# Patient Record
Sex: Male | Born: 1940 | Race: White | Hispanic: No | Marital: Married | Smoking: Former smoker
Health system: Southern US, Community
[De-identification: ages and names within clinical notes are randomized; demographics above are authoritative.]

---

## 2006-07-26 DIAGNOSIS — R609 Edema, unspecified: Secondary | ICD-10-CM | POA: Insufficient documentation

## 2006-07-26 DIAGNOSIS — Z720 Tobacco use: Secondary | ICD-10-CM | POA: Insufficient documentation

## 2006-12-17 DIAGNOSIS — R5381 Other malaise: Secondary | ICD-10-CM | POA: Insufficient documentation

## 2008-08-28 DIAGNOSIS — K4021 Bilateral inguinal hernia, without obstruction or gangrene, recurrent: Secondary | ICD-10-CM | POA: Insufficient documentation

## 2009-10-25 ENCOUNTER — Ambulatory Visit: Payer: Self-pay | Admitting: Family Medicine

## 2010-08-08 ENCOUNTER — Inpatient Hospital Stay: Payer: Self-pay | Admitting: Surgery

## 2010-08-08 HISTORY — PX: HERNIA REPAIR: SHX51

## 2010-10-07 ENCOUNTER — Ambulatory Visit: Payer: Self-pay | Admitting: Surgery

## 2010-11-12 ENCOUNTER — Ambulatory Visit: Payer: Self-pay | Admitting: Gastroenterology

## 2011-01-12 ENCOUNTER — Ambulatory Visit: Payer: Self-pay | Admitting: Surgery

## 2012-04-21 ENCOUNTER — Ambulatory Visit: Payer: Self-pay | Admitting: Family Medicine

## 2012-09-08 LAB — PSA: PSA: 0.5

## 2013-08-03 LAB — BASIC METABOLIC PANEL
BUN: 20 mg/dL (ref 4–21)
CREATININE: 1.4 mg/dL — AB (ref <=1.3)
Glucose: 95 mg/dL
Potassium: 5 mmol/L (ref 3.4–5.3)
SODIUM: 141 mmol/L (ref 137–147)

## 2013-08-03 LAB — HEPATIC FUNCTION PANEL
ALK PHOS: 51 U/L (ref 25–125)
ALT: 9 U/L — AB (ref 10–40)
AST: 16 U/L (ref 14–40)
BILIRUBIN, TOTAL: 0.5 mg/dL

## 2013-11-09 LAB — TSH: TSH: 1.37 u[IU]/mL (ref <=5.90)

## 2013-12-20 ENCOUNTER — Emergency Department: Payer: Self-pay | Admitting: Emergency Medicine

## 2013-12-20 LAB — CBC WITH DIFFERENTIAL/PLATELET
BASOS PCT: 0.6 %
Basophil #: 0 10*3/uL (ref 0.0–0.1)
Eosinophil #: 0.3 10*3/uL (ref 0.0–0.7)
Eosinophil %: 3 %
HCT: 47.8 % (ref 40.0–52.0)
HGB: 15.5 g/dL (ref 13.0–18.0)
LYMPHS ABS: 1.5 10*3/uL (ref 1.0–3.6)
LYMPHS PCT: 17.4 %
MCH: 32.8 pg (ref 26.0–34.0)
MCHC: 32.5 g/dL (ref 32.0–36.0)
MCV: 101 fL — ABNORMAL HIGH (ref 80–100)
MONOS PCT: 7.1 %
Monocyte #: 0.6 x10 3/mm (ref 0.2–1.0)
Neutrophil #: 6.1 10*3/uL (ref 1.4–6.5)
Neutrophil %: 71.9 %
PLATELETS: 339 10*3/uL (ref 150–440)
RBC: 4.73 10*6/uL (ref 4.40–5.90)
RDW: 13.8 % (ref 11.5–14.5)
WBC: 8.5 10*3/uL (ref 3.8–10.6)

## 2013-12-20 LAB — URINALYSIS, COMPLETE
BILIRUBIN, UR: NEGATIVE
Bacteria: NONE SEEN
Blood: NEGATIVE
Glucose,UR: NEGATIVE mg/dL (ref 0–75)
Hyaline Cast: 2
Ketone: NEGATIVE
LEUKOCYTE ESTERASE: NEGATIVE
Nitrite: NEGATIVE
PH: 7 (ref 4.5–8.0)
Protein: NEGATIVE
RBC,UR: 2 /HPF (ref 0–5)
SQUAMOUS EPITHELIAL: NONE SEEN
Specific Gravity: 1.016 (ref 1.003–1.030)

## 2013-12-20 LAB — COMPREHENSIVE METABOLIC PANEL
ALT: 18 U/L
ANION GAP: 0 — AB (ref 7–16)
Albumin: 4.1 g/dL (ref 3.4–5.0)
Alkaline Phosphatase: 48 U/L
BUN: 20 mg/dL — ABNORMAL HIGH (ref 7–18)
Bilirubin,Total: 0.5 mg/dL (ref 0.2–1.0)
CO2: 30 mmol/L (ref 21–32)
Calcium, Total: 8.8 mg/dL (ref 8.5–10.1)
Chloride: 104 mmol/L (ref 98–107)
Creatinine: 1.57 mg/dL — ABNORMAL HIGH (ref 0.60–1.30)
EGFR (African American): 56 — ABNORMAL LOW
EGFR (Non-African Amer.): 46 — ABNORMAL LOW
Glucose: 136 mg/dL — ABNORMAL HIGH (ref 65–99)
POTASSIUM: 4.7 mmol/L (ref 3.5–5.1)
SGOT(AST): 30 U/L (ref 15–37)
Sodium: 134 mmol/L — ABNORMAL LOW (ref 136–145)
Total Protein: 7.6 g/dL (ref 6.4–8.2)

## 2013-12-20 LAB — TROPONIN I: Troponin-I: 0.02 ng/mL

## 2014-05-10 LAB — LIPID PANEL
Cholesterol: 178 mg/dL (ref 0–200)
HDL: 35 mg/dL (ref 35–70)
LDL CALC: 103 mg/dL
Triglycerides: 200 mg/dL — AB (ref 40–160)

## 2014-05-10 LAB — CBC AND DIFFERENTIAL
HEMATOCRIT: 44 % (ref 41–53)
Hemoglobin: 14.7 g/dL (ref 13.5–17.5)
NEUTROS ABS: 51 /uL
Platelets: 318 10*3/uL (ref 150–399)
WBC: 7.5 10*3/mL

## 2014-06-27 DIAGNOSIS — F419 Anxiety disorder, unspecified: Secondary | ICD-10-CM | POA: Insufficient documentation

## 2014-06-27 DIAGNOSIS — H6120 Impacted cerumen, unspecified ear: Secondary | ICD-10-CM | POA: Insufficient documentation

## 2014-06-27 DIAGNOSIS — K30 Functional dyspepsia: Secondary | ICD-10-CM | POA: Insufficient documentation

## 2014-06-27 DIAGNOSIS — IMO0001 Reserved for inherently not codable concepts without codable children: Secondary | ICD-10-CM | POA: Insufficient documentation

## 2014-06-27 DIAGNOSIS — N433 Hydrocele, unspecified: Secondary | ICD-10-CM | POA: Insufficient documentation

## 2014-06-27 DIAGNOSIS — B029 Zoster without complications: Secondary | ICD-10-CM | POA: Insufficient documentation

## 2014-06-27 DIAGNOSIS — L299 Pruritus, unspecified: Secondary | ICD-10-CM | POA: Insufficient documentation

## 2014-06-27 DIAGNOSIS — R634 Abnormal weight loss: Secondary | ICD-10-CM | POA: Insufficient documentation

## 2014-06-27 DIAGNOSIS — R079 Chest pain, unspecified: Secondary | ICD-10-CM | POA: Insufficient documentation

## 2014-06-27 DIAGNOSIS — R799 Abnormal finding of blood chemistry, unspecified: Secondary | ICD-10-CM | POA: Insufficient documentation

## 2014-06-27 DIAGNOSIS — E785 Hyperlipidemia, unspecified: Secondary | ICD-10-CM | POA: Insufficient documentation

## 2014-06-27 DIAGNOSIS — Q394 Esophageal web: Secondary | ICD-10-CM | POA: Insufficient documentation

## 2014-06-27 DIAGNOSIS — I10 Essential (primary) hypertension: Secondary | ICD-10-CM | POA: Insufficient documentation

## 2014-06-27 DIAGNOSIS — G2581 Restless legs syndrome: Secondary | ICD-10-CM | POA: Insufficient documentation

## 2014-06-27 DIAGNOSIS — J449 Chronic obstructive pulmonary disease, unspecified: Secondary | ICD-10-CM | POA: Insufficient documentation

## 2014-06-27 DIAGNOSIS — Z8719 Personal history of other diseases of the digestive system: Secondary | ICD-10-CM | POA: Insufficient documentation

## 2014-06-27 DIAGNOSIS — R0602 Shortness of breath: Secondary | ICD-10-CM | POA: Insufficient documentation

## 2014-06-27 DIAGNOSIS — K589 Irritable bowel syndrome without diarrhea: Secondary | ICD-10-CM | POA: Insufficient documentation

## 2014-06-27 DIAGNOSIS — F4323 Adjustment disorder with mixed anxiety and depressed mood: Secondary | ICD-10-CM | POA: Insufficient documentation

## 2014-08-20 ENCOUNTER — Ambulatory Visit (INDEPENDENT_AMBULATORY_CARE_PROVIDER_SITE_OTHER): Payer: Medicare PPO | Admitting: Family Medicine

## 2014-08-20 ENCOUNTER — Encounter: Payer: Self-pay | Admitting: Family Medicine

## 2014-08-20 VITALS — BP 118/78 | HR 90 | Temp 97.5°F | Resp 16 | Wt 170.4 lb

## 2014-08-20 DIAGNOSIS — K219 Gastro-esophageal reflux disease without esophagitis: Secondary | ICD-10-CM | POA: Diagnosis not present

## 2014-08-20 DIAGNOSIS — K222 Esophageal obstruction: Secondary | ICD-10-CM

## 2014-08-20 DIAGNOSIS — I10 Essential (primary) hypertension: Secondary | ICD-10-CM | POA: Diagnosis not present

## 2014-08-20 DIAGNOSIS — F411 Generalized anxiety disorder: Secondary | ICD-10-CM | POA: Diagnosis not present

## 2014-08-20 DIAGNOSIS — J449 Chronic obstructive pulmonary disease, unspecified: Secondary | ICD-10-CM | POA: Diagnosis not present

## 2014-08-20 DIAGNOSIS — Q394 Esophageal web: Secondary | ICD-10-CM | POA: Diagnosis not present

## 2014-08-20 NOTE — Progress Notes (Signed)
Subjective:    Patient ID: Andrew Hall, male    DOB: May 01, 1940, 74 y.o.   MRN: [id number]  HPI Follow-up - Hypertension stable without chest pains or palpitations. Still taking Lisinopril-HCTZ without muscle cramps of significance.   COPD stable and rarely uses Albuterol. Dyspnea only with climbing stairs. No problems with level ground. Evening and early morning cough with sputum production. No wheeze.  Anxiety well controlled with Zoloft and Clonazepam at bedtime. Also seems to control restless leg issues. No suicidal thoughts or depression. Still very concerned about wife's declining health with dementia and inability to get out of bed or ambulate without help.  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 Niel Hummer. Still having some dysphagia occasionally with large bolus or dry foods. Still feel the Omeprazole is controlling the dyspepsia. No melena or hematemesis.  Patient Active Problem List   Diagnosis Date Noted  . Abnormal blood chemistry 06/27/2014  . Adjustment disorder with mixed anxiety and depressed mood 06/27/2014  . Anxiety disorder 06/27/2014  . Chest pain on exertion 06/27/2014  . COPD, mild 06/27/2014  . Acid indigestion 06/27/2014  . Can't get food down 06/27/2014  . Breath shortness 06/27/2014  . Herpes zona 06/27/2014  . History of digestive disease 06/27/2014  . Hydrocele 06/27/2014  . HLD (hyperlipidemia) 06/27/2014  . Benign hypertension 06/27/2014  . BP (high blood pressure) 06/27/2014  . 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 Cerumen impaction 06/27/2014  . Pruritic dermatitis 06/27/2014  . Restless leg 06/27/2014  . 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 Unexplained weight loss 06/27/2014  . Inguinal hernia recurrent bilateral 08/28/2008  . 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 Malaise and fatigue 12/17/2006  . Accumulation of fluid in tissues 07/26/2006  . Essential (primary) hypertension 07/26/2006   . Tobacco use 07/26/2006   Past Surgical History  Procedure Laterality Date  . Hernia repair Bilateral 08/08/2010   History  Substance Use Topics  . Smoking status: Former Games developer  . Smokeless tobacco: Not on file  . Alcohol Use: No     Comment: stopped in 2012   Family History  Problem Relation Age of Onset  . Heart disease Mother    Current Outpatient Prescriptions on File Prior to Visit  Medication Sig Dispense Refill  . albuterol (PROVENTIL HFA;VENTOLIN HFA) 108 (90 BASE) MCG/ACT inhaler Inhale 2 puffs into the lungs 4 (four) times daily as needed.    . B Complex Vitamins (VITAMIN B COMPLEX PO) Take by mouth daily.    . clonazePAM (KLONOPIN) 0.5 MG tablet Take 1 tablet by mouth at bedtime.    . fenofibrate 160 MG tablet Take 1 tablet by mouth daily.    Marland Kitchen FLUTICASONE-SALMETEROL 1 Inhaler 2 (two) times daily.    Marland Kitchen lisinopril-hydrochlorothiazide (PRINZIDE,ZESTORETIC) 10-12.5 MG per tablet Take 1 tablet by mouth daily.    . Omega 3 1000 MG CAPS Take 1 capsule by mouth daily.    Marland Kitchen omeprazole (PRILOSEC) 40 MG capsule Take 1 capsule by mouth daily.    . sertraline (ZOLOFT) 50 MG tablet Take 1 tablet by mouth at bedtime.     No current facility-administered medications on file prior to visit.   No Known Allergies  Review of Systems  Constitutional: Negative.   HENT: Negative.   Eyes: Negative.   Respiratory: Positive for cough and wheezing.        COPD causing some morning cough with some dyspnea with climbing stairs.  Cardiovascular:  Negative.   Gastrointestinal: Negative for constipation and blood in stool.       Dysphagia and dyspepsia occasionally  Genitourinary: Negative.   Musculoskeletal: Positive for arthralgias.       Some stiffness and aching in hands occasionally (R>L)  Neurological: Positive for tremors. Negative for dizziness, facial asymmetry and light-headedness.       Mild fine tremor of hands  Psychiatric/Behavioral: The patient is nervous/anxious.    BP  Readings from Last 3 Encounters:  08/20/14 118/78  05/19/14 132/74       BP 118/78 mmHg  Pulse 90  Temp(Src) 97.5 F (36.4 C) (Oral)  Resp 16  Wt 170 lb 6.4 oz (77.293 kg)  SpO2 95%  Objective:   Physical Exam  Constitutional: He is oriented to person, place, and time. He appears well-developed and well-nourished. No distress.  HENT:  Head: Normocephalic and atraumatic.  Right Ear: Hearing and external ear normal.  Left Ear: Hearing and external ear normal.  Nose: Nose normal.  Hearing occasionally more difficult listening to human voices (mid range)  Eyes: Conjunctivae, EOM and lids are normal. Right eye exhibits no discharge. Left eye exhibits no discharge. No scleral icterus.  Neck: Neck supple.  Cardiovascular: Normal rate, regular rhythm and normal heart sounds.   Pulmonary/Chest: Effort normal. No respiratory distress.  Abdominal: Soft. Bowel sounds are normal.  Musculoskeletal: Normal range of motion.  Neurological: He is alert and oriented to person, place, and time.  Skin: Skin is intact. No lesion and no rash noted.  Psychiatric: He has a normal mood and affect. His speech is normal and behavior is normal. Thought content normal.      Assessment & Plan:  1. Essential (primary) hypertension Stable and well controlled. Tolerating Lisinopril/HCTZ without side effects. Good compliance. Will get routine follow up labs and recheck pending reports. - CBC with Differential/Platelet - Comprehensive metabolic panel  2. COPD, mild Some early morning and evening cough. Some slight dyspnea with climbing stairs. No chest pain, palpitations or wheeze. May use Albuterol prn.  3. Generalized anxiety disorder Well controlled with daily Clonazepam and Sertraline at bedtime. Also controls nocturnal restless leg syndrome. Recheck pending lab reports. Continue present regimen. Recommend he schedule annual wellness exam in October 2016.   4. Schatzki's ring Documented by Dr. Niel HummerIftikhar  in 2014 by upper endoscopy. Refuses referral for follow up endoscopy due to wife's very poor health with dementia. He is the main caregiver.  5. GERD with stricture Stable and well controlled with prn use of Omeprazole 40 mg qd. No hematemesis or melena. Limit dry foods, large bolus meats and recheck if worsening. May need referral back to GI. - CBC with Differential/Platelet

## 2014-08-23 DIAGNOSIS — K222 Esophageal obstruction: Secondary | ICD-10-CM | POA: Diagnosis not present

## 2014-08-23 DIAGNOSIS — I10 Essential (primary) hypertension: Secondary | ICD-10-CM | POA: Diagnosis not present

## 2014-08-23 DIAGNOSIS — K219 Gastro-esophageal reflux disease without esophagitis: Secondary | ICD-10-CM | POA: Diagnosis not present

## 2014-08-24 LAB — CBC WITH DIFFERENTIAL/PLATELET
BASOS: 1 %
Basophils Absolute: 0.1 10*3/uL (ref 0.0–0.2)
EOS (ABSOLUTE): 0.5 10*3/uL — ABNORMAL HIGH (ref 0.0–0.4)
Eos: 4 %
Hematocrit: 43 % (ref 37.5–51.0)
Hemoglobin: 14.5 g/dL (ref 12.6–17.7)
IMMATURE GRANULOCYTES: 0 %
Immature Grans (Abs): 0 10*3/uL (ref 0.0–0.1)
Lymphocytes Absolute: 2.7 10*3/uL (ref 0.7–3.1)
Lymphs: 26 %
MCH: 33 pg (ref 26.6–33.0)
MCHC: 33.7 g/dL (ref 31.5–35.7)
MCV: 98 fL — AB (ref 79–97)
MONOS ABS: 0.8 10*3/uL (ref 0.1–0.9)
Monocytes: 8 %
NEUTROS PCT: 61 %
Neutrophils Absolute: 6.4 10*3/uL (ref 1.4–7.0)
Platelets: 372 10*3/uL (ref 150–379)
RBC: 4.4 x10E6/uL (ref 4.14–5.80)
RDW: 13.8 % (ref 12.3–15.4)
WBC: 10.5 10*3/uL (ref 3.4–10.8)

## 2014-08-24 LAB — COMPREHENSIVE METABOLIC PANEL
A/G RATIO: 2 (ref 1.1–2.5)
ALK PHOS: 45 IU/L (ref 39–117)
ALT: 13 IU/L (ref 0–44)
AST: 19 IU/L (ref 0–40)
Albumin: 4.4 g/dL (ref 3.5–4.8)
BUN/Creatinine Ratio: 13 (ref 10–22)
BUN: 18 mg/dL (ref 8–27)
Bilirubin Total: 0.6 mg/dL (ref 0.0–1.2)
CALCIUM: 9.5 mg/dL (ref 8.6–10.2)
CO2: 27 mmol/L (ref 18–29)
Chloride: 98 mmol/L (ref 97–108)
Creatinine, Ser: 1.37 mg/dL — ABNORMAL HIGH (ref 0.76–1.27)
GFR calc Af Amer: 58 mL/min/{1.73_m2} — ABNORMAL LOW (ref >59)
GFR calc non Af Amer: 50 mL/min/{1.73_m2} — ABNORMAL LOW (ref >59)
Globulin, Total: 2.2 g/dL (ref 1.5–4.5)
Glucose: 97 mg/dL (ref 65–99)
POTASSIUM: 5.3 mmol/L — AB (ref 3.5–5.2)
Sodium: 139 mmol/L (ref 134–144)
Total Protein: 6.6 g/dL (ref 6.0–8.5)

## 2014-08-28 ENCOUNTER — Telehealth: Payer: Self-pay

## 2014-08-28 NOTE — Telephone Encounter (Signed)
-----   Message from Tamsen Roersennis E Chrismon, [place] sent at 08/24/2014  6:45 PM EDT ----- Normal blood counts. Kidney function test improving. Recommend he drink plenty of water and recheck levels in 3 months.

## 2014-08-28 NOTE — Telephone Encounter (Signed)
Left patient a detailed message on home voicemail (consent in chart) advising him that labs were normal, drink plenty of water, and call back to schedule follow up appointment.

## 2014-09-07 ENCOUNTER — Telehealth: Payer: Self-pay | Admitting: Family Medicine

## 2014-09-07 DIAGNOSIS — F419 Anxiety disorder, unspecified: Secondary | ICD-10-CM

## 2014-09-07 MED ORDER — CLONAZEPAM 0.5 MG PO TABS: 0.5000 mg | ORAL_TABLET | Freq: Every day | ORAL | Status: DC

## 2014-09-07 NOTE — Telephone Encounter (Signed)
Patient needs a refill on clonazePAM (KLONOPIN) 0.5 MG tablet.  Pt uses [facility] [address].  He has four pills left.  CB# [phone]

## 2014-09-07 NOTE — Telephone Encounter (Signed)
Advise patient the refill has been printed out because it can't be sent electronically. Ready for pick up at the front desk.

## 2014-09-10 ENCOUNTER — Other Ambulatory Visit: Payer: Self-pay | Admitting: Family Medicine

## 2014-09-10 NOTE — Telephone Encounter (Signed)
Advise patient prescription printed on 09-07-14 and ready for pick up at the front desk.

## 2014-09-12 NOTE — Telephone Encounter (Signed)
Documentation shows that pt picked up the prescription on July18th.

## 2014-10-01 ENCOUNTER — Other Ambulatory Visit: Payer: Self-pay | Admitting: Family Medicine

## 2014-10-30 ENCOUNTER — Telehealth: Payer: Self-pay | Admitting: Family Medicine

## 2014-10-30 MED ORDER — FENOFIBRATE 160 MG PO TABS: 160.0000 mg | ORAL_TABLET | Freq: Every day | ORAL | Status: DC

## 2014-10-30 NOTE — Telephone Encounter (Signed)
Needs refill on Fenofibrate 160 mg. To [facility] [address]

## 2014-12-10 ENCOUNTER — Ambulatory Visit (INDEPENDENT_AMBULATORY_CARE_PROVIDER_SITE_OTHER): Payer: Medicare PPO | Admitting: Family Medicine

## 2014-12-10 ENCOUNTER — Encounter: Payer: Self-pay | Admitting: Family Medicine

## 2014-12-10 VITALS — BP 132/60 | HR 51 | Temp 98.1°F | Resp 16 | Ht 67.75 in | Wt 170.6 lb

## 2014-12-10 DIAGNOSIS — Q394 Esophageal web: Secondary | ICD-10-CM | POA: Diagnosis not present

## 2014-12-10 DIAGNOSIS — I10 Essential (primary) hypertension: Secondary | ICD-10-CM

## 2014-12-10 DIAGNOSIS — E785 Hyperlipidemia, unspecified: Secondary | ICD-10-CM

## 2014-12-10 DIAGNOSIS — Z23 Encounter for immunization: Secondary | ICD-10-CM | POA: Diagnosis not present

## 2014-12-10 DIAGNOSIS — Z0000 Encounter for general adult medical examination without abnormal findings: Secondary | ICD-10-CM | POA: Diagnosis not present

## 2014-12-10 DIAGNOSIS — F419 Anxiety disorder, unspecified: Secondary | ICD-10-CM | POA: Diagnosis not present

## 2014-12-10 DIAGNOSIS — J449 Chronic obstructive pulmonary disease, unspecified: Secondary | ICD-10-CM

## 2014-12-10 DIAGNOSIS — H6123 Impacted cerumen, bilateral: Secondary | ICD-10-CM

## 2014-12-10 DIAGNOSIS — G2581 Restless legs syndrome: Secondary | ICD-10-CM

## 2014-12-10 MED ORDER — CLONAZEPAM 0.5 MG PO TABS: 0.5000 mg | ORAL_TABLET | Freq: Every day | ORAL | Status: DC

## 2014-12-10 NOTE — Progress Notes (Signed)
Patient ID: Andrew LinCurtis W Hemmelgarn, male   DOB: 12/03/1940, 74 y.o.   MRN: [id number] Patient: Andrew Hall, Male    DOB: 12/03/1940, 74 y.o.   MRN: [id number] Visit Date: 12/10/2014  Today's Provider: Dortha Kernennis Atara [place], PA   Chief Complaint  Patient presents with  . Annual Exam   Subjective:  Andrew Hall is a 74 y.o. male who presents today for health maintenance and complete physical. He feels fairly well. He reports exercising none (limited due to COPD and wife's poor heath - he is the primary caregiver). He reports he is sleeping well (8-10 hours a night with urinating 1-2 times a night). Feels COPD still causing white sputum in the evening. Not using the Advair but continues Mucinex with a little relief. No fever. Having some nasal congestion and post nasal drip in the evenings. Clonazepam helps him sleep well, controls anxiety and restless leg syndrome. No depressive symptoms. Stressed about wife's poor health and he has to be the caregiver.   Review of Systems  Constitutional: Negative.   HENT: Positive for congestion.   Eyes: Negative.   Respiratory: Negative.   Cardiovascular: Negative.   Gastrointestinal: Negative.   Endocrine: Negative.   Genitourinary: Negative.   Musculoskeletal: Negative.   Skin: Negative.   Allergic/Immunologic: Negative.   Neurological: Negative.   Hematological: Negative.   Psychiatric/Behavioral: Negative.     Social History   Social History  . Marital Status: Married    Spouse Name: N/A  . Number of Children: N/A  . Years of Education: N/A   Occupational History  . Not on file.   Social History Main Topics  . Smoking status: Former Games developermoker  . Smokeless tobacco: Not on file  . Alcohol Use: No     Comment: stopped in 2012  . Drug Use: No  . Sexual Activity: Not on file   Other Topics Concern  . Not on file   Social History Narrative    Patient Active Problem List   Diagnosis Date Noted  . Abnormal blood chemistry 06/27/2014  .  Adjustment disorder with mixed anxiety and depressed mood 06/27/2014  . Anxiety disorder 06/27/2014  . Chest pain on exertion 06/27/2014  . COPD, mild (HCC) 06/27/2014  . Acid indigestion 06/27/2014  . Can't get food down 06/27/2014  . Breath shortness 06/27/2014  . Herpes zona 06/27/2014  . History of digestive disease 06/27/2014  . Hydrocele 06/27/2014  . HLD (hyperlipidemia) 06/27/2014  . Benign hypertension 06/27/2014  . BP (high blood pressure) 06/27/2014  . 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 Cerumen impaction 06/27/2014  . Pruritic dermatitis 06/27/2014  . Restless leg 06/27/2014  . 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 Unexplained weight loss 06/27/2014  . Inguinal hernia recurrent bilateral 08/28/2008  . 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 Malaise and fatigue 12/17/2006  . Accumulation of fluid in tissues 07/26/2006  . Essential (primary) hypertension 07/26/2006  . Tobacco use 07/26/2006    Past Surgical History  Procedure Laterality Date  . Hernia repair Bilateral 08/08/2010    His family history includes Heart disease in his mother.    No Known Allergies   Outpatient Prescriptions Prior to Visit  Medication Sig Dispense Refill  . albuterol (PROVENTIL HFA;VENTOLIN HFA) 108 (90 BASE) MCG/ACT inhaler Inhale 2 puffs into the lungs 4 (four) times daily as needed.    . B Complex Vitamins (VITAMIN B COMPLEX PO) Take by mouth daily.    . clonazePAM (KLONOPIN) 0.5 MG tablet TAKE ONE TABLET BY MOUTH EVERY  DAY AT BEDTIME 30 tablet 0  . fenofibrate 160 MG tablet Take 1 tablet (160 mg total) by mouth daily. 30 tablet 6  . FLUTICASONE-SALMETEROL 1 Inhaler 2 (two) times daily.    Marland Kitchen lisinopril-hydrochlorothiazide (PRINZIDE,ZESTORETIC) 10-12.5 MG per tablet TAKE ONE TABLET BY MOUTH ONCE DAILY 30 tablet 6  . Omega 3 1000 MG CAPS Take 1 capsule by mouth daily.    Marland Kitchen omeprazole (PRILOSEC) 40 MG capsule Take 1 capsule by mouth daily.    . sertraline (ZOLOFT) 50 MG tablet Take 1 tablet by  mouth at bedtime.     No facility-administered medications prior to visit.    Patient Care Team: Tamsen Roers, PA as PCP - General (Physician Assistant)     Objective:   Vitals:  Filed Vitals:   12/10/14 1421  BP: 132/60  Pulse: 51  Temp: 98.1 F (36.7 C)  TempSrc: Oral  Resp: 16  Height: 5' 7.75" (1.721 m)  Weight: 170 lb 9.6 oz (77.384 kg)  SpO2: 95%    Physical Exam  Constitutional: He is oriented to person, place, and time. He appears well-developed and well-nourished.  HENT:  Head: Normocephalic and atraumatic.  Nose: Nose normal.  Mouth/Throat: Oropharynx is clear and moist.  Large amount of cerumen both ear canals.  Eyes: Conjunctivae and EOM are normal. Pupils are equal, round, and reactive to light.  Neck: Normal range of motion. Neck supple.  Cardiovascular: Normal rate, regular rhythm and normal heart sounds.   Pulmonary/Chest: Effort normal.  Distant breath sounds. Slight rhonchi that clears with a cough.   Abdominal: Soft. Bowel sounds are normal.  Musculoskeletal: He exhibits no edema or tenderness.  Back stiffness limiting full flexion.  Neurological: He is alert and oriented to person, place, and time. He has normal reflexes.  Hearing deficit worse with cerumen impaction.  Skin: No rash noted.  Psychiatric: His behavior is normal. Thought content normal.  Anxious and slight "jitters". Worried about caring for wife as she can easily fall.   Depression Screen PHQ 2/9 Scores 12/10/2014  PHQ - 2 Score 1  Anxious and has restless leg syndrome.   Assessment & Plan:     Routine Health Maintenance and Physical Exam  Exercise Activities and Dietary recommendations Goals    Need to lower fats in diet and walk when possible for exercise.      Immunization History  Administered Date(s) Administered  . Pneumococcal Conjugate-13 07/26/2014  . Tdap 09/05/2012  . Zoster 07/26/2014    Health Maintenance  Topic Date Due  . INFLUENZA VACCINE   09/24/2014  . PNA vac Low Risk Adult (2 of 2 - PPSV23) 07/26/2015  . TETANUS/TDAP  09/06/2022  . ZOSTAVAX  Completed      Discussed health benefits of physical activity, and encouraged him to engage in regular exercise appropriate for his age and condition.    ------------------------------------------------------------------------------------------------------------  1. Medicare annual wellness visit, subsequent Good general health. Last colonoscopy 11-12-10 only showing sigmoid diverticulosis. Will up date immunizations with flu shot. Fall screening negative and no alcohol use.   2. Benign hypertension Good control with Lisinopril-HCTZ without side effects. Good compliance. Will get routine labs and recheck prn. - CBC with Differential/Platelet  3. COPD, mild (HCC) History of heavy smoking in the past. Spirometry on 09-05-12 showed moderately severe obstructive disease with low VC. Continues to keep Ventolin-HFA available for prn use. Stopped smoking in 2012.  4. 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 No swallowing difficulty, dyspepsia or melena with routine use of Omeprazole 40 mg qd. Follow up with gastroenterologist as planned previously.  5. Cerumen impaction, bilateral Irrigated cerumen from both ears. Hearing not improved. 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 Declines hearing aid referral at the present.  6. Anxiety disorder, unspecified anxiety disorder type Still quite concerned about wife's poor health and being her primary caregiver. Clonazepam controlling anxiety and helps with sleep without making him too drowsy to respond to wife's needs. Will refill medication and recheck prn. - clonazePAM (KLONOPIN) 0.5 MG tablet; Take 1 tablet (0.5 mg total) by mouth at bedtime.  Dispense: 30 tablet; Refill: 1  7. HLD (hyperlipidemia) Continue Fenofibrate and Omega-3 Fish Oil without side effects. Will  recheck labs and follow up pending reports. - COMPLETE METABOLIC PANEL WITH GFR - Lipid panel - TSH  8. Restless leg Sleeping better with use of Clonazepam without daytime drowsiness. No history of falls or balance issues. Will refill medication and follow up pending lab reports - clonazePAM (KLONOPIN) 0.5 MG tablet; Take 1 tablet (0.5 mg total) by mouth at bedtime.  Dispense: 30 tablet; Refill: 1  9. Need for influenza vaccination - Flu vaccine HIGH DOSE PF

## 2014-12-13 DIAGNOSIS — E785 Hyperlipidemia, unspecified: Secondary | ICD-10-CM | POA: Diagnosis not present

## 2014-12-13 DIAGNOSIS — I10 Essential (primary) hypertension: Secondary | ICD-10-CM | POA: Diagnosis not present

## 2014-12-14 LAB — CBC WITH DIFFERENTIAL/PLATELET
Basophils Absolute: 0.1 10*3/uL (ref 0.0–0.2)
Basos: 1 %
EOS (ABSOLUTE): 0.5 10*3/uL — ABNORMAL HIGH (ref 0.0–0.4)
EOS: 6 %
HEMOGLOBIN: 14.3 g/dL (ref 12.6–17.7)
Hematocrit: 42.9 % (ref 37.5–51.0)
Immature Grans (Abs): 0 10*3/uL (ref 0.0–0.1)
Immature Granulocytes: 0 %
LYMPHS ABS: 2.3 10*3/uL (ref 0.7–3.1)
Lymphs: 30 %
MCH: 32.9 pg (ref 26.6–33.0)
MCHC: 33.3 g/dL (ref 31.5–35.7)
MCV: 99 fL — ABNORMAL HIGH (ref 79–97)
MONOCYTES: 7 %
Monocytes Absolute: 0.5 10*3/uL (ref 0.1–0.9)
NEUTROS ABS: 4.2 10*3/uL (ref 1.4–7.0)
Neutrophils: 56 %
Platelets: 337 10*3/uL (ref 150–379)
RBC: 4.35 x10E6/uL (ref 4.14–5.80)
RDW: 13.4 % (ref 12.3–15.4)
WBC: 7.6 10*3/uL (ref 3.4–10.8)

## 2014-12-14 LAB — TSH: TSH: 1.76 u[IU]/mL (ref 0.450–4.500)

## 2014-12-14 LAB — LIPID PANEL
Chol/HDL Ratio: 4.6 ratio units (ref 0.0–5.0)
Cholesterol, Total: 170 mg/dL (ref 100–199)
HDL: 37 mg/dL — ABNORMAL LOW (ref >39)
LDL CALC: 102 mg/dL — AB (ref 0–99)
Triglycerides: 156 mg/dL — ABNORMAL HIGH (ref 0–149)
VLDL CHOLESTEROL CAL: 31 mg/dL (ref 5–40)

## 2014-12-17 ENCOUNTER — Telehealth: Payer: Self-pay

## 2014-12-17 NOTE — Telephone Encounter (Signed)
Left message to call back.  Thanks, 

## 2014-12-17 NOTE — Telephone Encounter (Signed)
-----   Message from Jodell Ciproennis E [place], [place] sent at 12/14/2014  8:00 PM EDT ----- Thyroid and blood cell counts normal. Triglycerides and cholesterol better. Continue present medications and recheck levels in 6 months.

## 2014-12-17 NOTE — Telephone Encounter (Signed)
Patient and daughter Andrew Charsadvised-aa

## 2014-12-18 ENCOUNTER — Telehealth: Payer: Self-pay

## 2014-12-18 NOTE — Telephone Encounter (Signed)
-----   Message from Dennis E Chrismon, PA sent at 12/14/2014  8:00 PM EDT ----- Thyroid and blood cell counts normal. Triglycerides and cholesterol better. Continue present medications and recheck levels in 6 months. 

## 2014-12-18 NOTE — Telephone Encounter (Signed)
LMTCB

## 2014-12-18 NOTE — Telephone Encounter (Signed)
Advised pt as directed. Pt verbalized fully understanding.  Thanks,   

## 2015-01-07 ENCOUNTER — Other Ambulatory Visit: Payer: Self-pay | Admitting: Family Medicine

## 2015-01-07 MED ORDER — SERTRALINE HCL 50 MG PO TABS: 50.0000 mg | ORAL_TABLET | Freq: Every day | ORAL | Status: DC

## 2015-01-07 NOTE — Telephone Encounter (Signed)
Attempted to contact patient to advise him that RX has been sent to pharmacy. No answer nor voicemail.

## 2015-01-07 NOTE — Telephone Encounter (Signed)
Patient requesting refills on Sertraline.  Call to [facility] [address]

## 2015-02-04 ENCOUNTER — Other Ambulatory Visit: Payer: Self-pay | Admitting: Family Medicine

## 2015-02-04 DIAGNOSIS — G2581 Restless legs syndrome: Secondary | ICD-10-CM

## 2015-02-04 DIAGNOSIS — F419 Anxiety disorder, unspecified: Secondary | ICD-10-CM

## 2015-02-04 MED ORDER — CLONAZEPAM 0.5 MG PO TABS: 0.5000 mg | ORAL_TABLET | Freq: Every day | ORAL | Status: DC

## 2015-02-04 NOTE — Telephone Encounter (Signed)
Advise patient written prescription will be at the front desk for pick up.

## 2015-02-04 NOTE — Telephone Encounter (Signed)
Need refill on Clonazepam please.  Call when ready.  [phone]

## 2015-02-05 NOTE — Telephone Encounter (Signed)
RX can be called in at pharmacy[facility] Left patient a voicemail advising him.

## 2015-03-11 ENCOUNTER — Ambulatory Visit: Payer: Medicare PPO | Admitting: Family Medicine

## 2015-03-18 ENCOUNTER — Ambulatory Visit: Payer: Medicare PPO | Admitting: Family Medicine

## 2015-03-25 ENCOUNTER — Encounter: Payer: Self-pay | Admitting: Family Medicine

## 2015-03-25 ENCOUNTER — Ambulatory Visit (INDEPENDENT_AMBULATORY_CARE_PROVIDER_SITE_OTHER): Payer: Medicare PPO | Admitting: Family Medicine

## 2015-03-25 VITALS — BP 118/78 | HR 76 | Temp 97.6°F | Resp 16 | Wt 172.2 lb

## 2015-03-25 DIAGNOSIS — F4323 Adjustment disorder with mixed anxiety and depressed mood: Secondary | ICD-10-CM

## 2015-03-25 DIAGNOSIS — G2581 Restless legs syndrome: Secondary | ICD-10-CM

## 2015-03-25 DIAGNOSIS — E785 Hyperlipidemia, unspecified: Secondary | ICD-10-CM | POA: Diagnosis not present

## 2015-03-25 DIAGNOSIS — J449 Chronic obstructive pulmonary disease, unspecified: Secondary | ICD-10-CM | POA: Diagnosis not present

## 2015-03-25 DIAGNOSIS — I10 Essential (primary) hypertension: Secondary | ICD-10-CM | POA: Diagnosis not present

## 2015-03-25 NOTE — Progress Notes (Signed)
Patient ID: Andrew Hall, male   DOB: 03-21-1940, 75 y.o.   MRN: [id number]   Patient: Andrew Hall Male    DOB: [birth year]   75 y.o.   MRN: [id number] Visit Date: 03/25/2015  Today's Provider: Dortha Kern, PA   Chief Complaint  Patient presents with  . Hyperlipidemia  . Hypertension  . COPD  . Follow-up   Subjective:    HPI        Hypertension, follow-up:  BP Readings from Last 3 Encounters:  03/25/15 118/78  12/10/14 132/60  08/20/14 118/78    He was last seen for hypertension 3 months ago.  BP at that visit was 132/60. Management since that visit includes none  .He reports excellent compliance with treatment. He is not having side effects.  He is not exercising. He is adherent to low salt diet.   Outside blood pressures are pt is not checking blood pressure outside. He is experiencing none.  Patient denies none.   Cardiovascular risk factors include advanced age (older than 70 for men, 66 for women) and male gender.  Use of agents associated with hypertension: none.   ------------------------------------------------------------------------    Lipid/Cholesterol, Follow-up:   Last seen for this 3 months ago.  Management since that visit includes none.  Last Lipid Panel:    Component Value Date/Time   CHOL 170 12/13/2014 1211   CHOL 178 05/10/2014   TRIG 156* 12/13/2014 1211   HDL 37* 12/13/2014 1211   HDL 35 05/10/2014   CHOLHDL 4.6 12/13/2014 1211   LDLCALC 102* 12/13/2014 1211   LDLCALC 103 05/10/2014    He reports excellent compliance with treatment. He is having side effects.   Wt Readings from Last 3 Encounters:  03/25/15 172 lb 3.2 oz (78.109 kg)  12/10/14 170 lb 9.6 oz (77.384 kg)  08/20/14 170 lb 6.4 oz (77.293 kg)    ------------------------------------------------------------------------ COPD:  follow up. Stable Patient Active Problem List   Diagnosis Date Noted  . Abnormal blood chemistry 06/27/2014  . Adjustment disorder  with mixed anxiety and depressed mood 06/27/2014  . Anxiety disorder 06/27/2014  . Chest pain on exertion 06/27/2014  . COPD, mild (HCC) 06/27/2014  . Acid indigestion 06/27/2014  . Can't get food down 06/27/2014  . Breath shortness 06/27/2014  . Herpes zona 06/27/2014  . History of digestive disease 06/27/2014  . Hydrocele 06/27/2014  . HLD (hyperlipidemia) 06/27/2014  . Benign hypertension 06/27/2014  . BP (high blood pressure) 06/27/2014  . 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 Cerumen impaction 06/27/2014  . Pruritic dermatitis 06/27/2014  . Restless leg 06/27/2014  . 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 Unexplained weight loss 06/27/2014  . Inguinal hernia recurrent bilateral 08/28/2008  . 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 Malaise and fatigue 12/17/2006  . Accumulation of fluid in tissues 07/26/2006  . Essential (primary) hypertension 07/26/2006  . Tobacco use 07/26/2006   Past Surgical History  Procedure Laterality Date  . Hernia repair Bilateral 08/08/2010   Family History  Problem Relation Age of Onset  . Heart disease Mother      Previous Medications   ALBUTEROL (PROVENTIL HFA;VENTOLIN HFA) 108 (90 BASE) MCG/ACT INHALER    Inhale 2 puffs into the lungs 4 (four) times daily as needed.   B COMPLEX VITAMINS (VITAMIN B COMPLEX PO)    Take by mouth daily.   CLONAZEPAM (KLONOPIN) 0.5 MG TABLET    Take 1 tablet (0.5 mg total) by mouth at bedtime.   FENOFIBRATE 160 MG TABLET  Take 1 tablet (160 mg total) by mouth daily.   FLUTICASONE-SALMETEROL    1 Inhaler 2 (two) times daily.   LISINOPRIL-HYDROCHLOROTHIAZIDE (PRINZIDE,ZESTORETIC) 10-12.5 MG PER TABLET    TAKE ONE TABLET BY MOUTH ONCE DAILY   OMEGA 3 1000 MG CAPS    Take 1 capsule by mouth daily.   OMEPRAZOLE (PRILOSEC) 40 MG CAPSULE    Take 1 capsule by mouth daily.   SERTRALINE (ZOLOFT) 50 MG TABLET    Take 1 tablet (50 mg total) by mouth at bedtime.   No Known Allergies  Review of Systems  Constitutional: Negative.     HENT: Negative.   Eyes: Negative.   Respiratory: Negative.   Cardiovascular: Negative.   Gastrointestinal: Negative.   Endocrine: Negative.   Genitourinary: Negative.   Musculoskeletal: Negative.   Skin: Negative.   Allergic/Immunologic: Negative.   Neurological: Negative.   Hematological: Negative.   Psychiatric/Behavioral: Negative.     Social History  Substance Use Topics  . Smoking status: Former Games developer  . Smokeless tobacco: Not on file  . Alcohol Use: No     Comment: stopped in 2012   Objective:   BP 118/78 mmHg  Pulse 76  Temp(Src) 97.6 F (36.4 C) (Oral)  Resp 16  Wt 172 lb 3.2 oz (78.109 kg)  SpO2 95%  Physical Exam  Constitutional: He is oriented to person, place, and time. He appears well-developed and well-nourished. No distress.  HENT:  Head: Normocephalic and atraumatic.  Right Ear: Hearing normal.  Left Ear: Hearing normal.  Nose: Nose normal.  Eyes: Conjunctivae and lids are normal. Right eye exhibits no discharge. Left eye exhibits no discharge. No scleral icterus.  Neck: Neck supple.  Cardiovascular: Normal rate and regular rhythm.   Pulmonary/Chest: Effort normal and breath sounds normal. No respiratory distress.  Abdominal: Soft. Bowel sounds are normal.  Musculoskeletal: Normal range of motion.  Neurological: He is alert and oriented to person, place, and time.  Skin: Skin is intact. No lesion and no rash noted.  Psychiatric: He has a normal mood and affect. His speech is normal and behavior is normal. Thought content normal.      Assessment & Plan:      1. Benign hypertension Well controlled on the Zesteretic without side effects.Limit sodium intake and recheck in 3 months.   2. HLD (hyperlipidemia) Tolerating Fenofibrate 160 mg qd without myalgias.  Lab Results  Component Value Date   CHOL 170 12/13/2014   HDL 37* 12/13/2014   LDLCALC 102* 12/13/2014   TRIG 156* 12/13/2014   CHOLHDL 4.6 12/13/2014    3. COPD, mild (HCC) No  significant dyspnea today. Tolerating Albuterol and Advair with good control of wheeze, cough and congestion  4. Restless leg Sleeping better and less leg movement with use of Clonazepam 0.5 mg hs.  5. Adjustment disorder with mixed anxiety and depressed mood Clonazepam and Sertraline (one each at bedtime) well tolerated without drowsiness. Sleeping well and able to get up easily if his wife needs him at night. Continue present medications and recheck in 3 months for lab recheck.

## 2015-04-08 ENCOUNTER — Other Ambulatory Visit: Payer: Self-pay | Admitting: Family Medicine

## 2015-04-08 DIAGNOSIS — G2581 Restless legs syndrome: Secondary | ICD-10-CM

## 2015-04-08 DIAGNOSIS — F419 Anxiety disorder, unspecified: Secondary | ICD-10-CM

## 2015-04-08 NOTE — Telephone Encounter (Signed)
Please fax in a refill for Clonazepam to [facility] [address].

## 2015-04-10 NOTE — Telephone Encounter (Signed)
Patient called back today wanting to know if his Cl[facility]epam has been sent into [facility].

## 2015-04-11 MED ORDER — CLONAZEPAM 0.5 MG PO TABS: 0.5000 mg | ORAL_TABLET | Freq: Every day | ORAL | Status: DC

## 2015-04-11 NOTE — Telephone Encounter (Signed)
RX called in at [facility] pharmacy. Patient is aware.

## 2015-04-29 ENCOUNTER — Telehealth: Payer: Self-pay | Admitting: Family Medicine

## 2015-04-29 MED ORDER — LISINOPRIL-HYDROCHLOROTHIAZIDE 10-12.5 MG PO TABS: 1.0000 | ORAL_TABLET | Freq: Every day | ORAL | Status: DC

## 2015-04-29 NOTE — Telephone Encounter (Signed)
Needs refill on Lisinopril.

## 2015-05-06 ENCOUNTER — Telehealth: Payer: Self-pay | Admitting: Family Medicine

## 2015-05-06 MED ORDER — SERTRALINE HCL 50 MG PO TABS: 50.0000 mg | ORAL_TABLET | Freq: Every day | ORAL | Status: DC

## 2015-05-06 NOTE — Telephone Encounter (Signed)
Refill sent to chosen pharmacy for Sertraline.

## 2015-05-06 NOTE — Telephone Encounter (Signed)
Patient needs refills called in on Sertraline to [facility] on [address]

## 2015-05-28 ENCOUNTER — Other Ambulatory Visit: Payer: Self-pay | Admitting: Family Medicine

## 2015-05-28 DIAGNOSIS — E785 Hyperlipidemia, unspecified: Secondary | ICD-10-CM

## 2015-05-28 MED ORDER — FENOFIBRATE 160 MG PO TABS: 160.0000 mg | ORAL_TABLET | Freq: Every day | ORAL | Status: DC

## 2015-05-28 NOTE — Telephone Encounter (Signed)
Sent refill of Fenofibrate to the pharmacy. Keep follow up appointment on 06-17-15.

## 2015-05-28 NOTE — Telephone Encounter (Signed)
Pt needs refills on Fenofibrate called to [facility] on [address].

## 2015-05-29 NOTE — Telephone Encounter (Signed)
Left patient a voicemail advising him that RX has been sent to pharmacy and to keep follow up appointment on 06/17/2015

## 2015-06-07 ENCOUNTER — Telehealth: Payer: Self-pay | Admitting: Family Medicine

## 2015-06-07 DIAGNOSIS — G2581 Restless legs syndrome: Secondary | ICD-10-CM

## 2015-06-07 DIAGNOSIS — F419 Anxiety disorder, unspecified: Secondary | ICD-10-CM

## 2015-06-07 MED ORDER — CLONAZEPAM 0.5 MG PO TABS: 0.5000 mg | ORAL_TABLET | Freq: Every day | ORAL | Status: DC

## 2015-06-07 NOTE — Telephone Encounter (Signed)
Phone-in refill of Clonazepam to the [facility] on [address]. Advise patient when done.

## 2015-06-07 NOTE — Telephone Encounter (Signed)
Pt. Needs refill on clonazepam called to [facility] on [address]

## 2015-06-07 NOTE — Telephone Encounter (Signed)
RX called in and pt advised-aa 

## 2015-06-09 ENCOUNTER — Other Ambulatory Visit: Payer: Self-pay | Admitting: Family Medicine

## 2015-06-17 ENCOUNTER — Encounter: Payer: Self-pay | Admitting: Family Medicine

## 2015-06-17 ENCOUNTER — Ambulatory Visit (INDEPENDENT_AMBULATORY_CARE_PROVIDER_SITE_OTHER): Payer: Medicare PPO | Admitting: Family Medicine

## 2015-06-17 VITALS — BP 142/72 | HR 80 | Temp 97.4°F | Resp 16 | Wt 171.4 lb

## 2015-06-17 DIAGNOSIS — F419 Anxiety disorder, unspecified: Secondary | ICD-10-CM

## 2015-06-17 DIAGNOSIS — I10 Essential (primary) hypertension: Secondary | ICD-10-CM | POA: Diagnosis not present

## 2015-06-17 DIAGNOSIS — G2581 Restless legs syndrome: Secondary | ICD-10-CM | POA: Diagnosis not present

## 2015-06-17 DIAGNOSIS — E785 Hyperlipidemia, unspecified: Secondary | ICD-10-CM

## 2015-06-17 NOTE — Progress Notes (Signed)
Patient ID: Andrew Hall, male   DOB: 1940/04/14, 75 y.o.   MRN: [id number]   Patient: Andrew Hall Male    DOB: 06/02/1940   75 y.o.   MRN: [id number] Visit Date: 06/17/2015  Today's Provider: Dortha Kern, PA   Chief Complaint  Patient presents with  . Hyperlipidemia  . Hypertension  . Anxiety  . Follow-up   Subjective:    Anxiety Presents for follow-up visit. Symptoms include nervous/anxious behavior.   Compliance with medications is 76-100%.    Hypertension, follow-up:  BP Readings from Last 3 Encounters:  06/17/15 142/72  03/25/15 118/78  12/10/14 132/60    He was last seen for hypertension 3 months ago.  BP at that visit was 118/78. Management since that visit includes none .He reports good compliance with treatment. He is not having side effects.  He is not exercising. He is adherent to low salt diet.   Outside blood pressures are not being checked. He is experiencing none.  Patient denies none.   Cardiovascular risk factors include advanced age (older than 12 for men, 38 for women), dyslipidemia, hypertension and male gender.  Use of agents associated with hypertension: none.   ------------------------------------------------------------------------    Lipid/Cholesterol, Follow-up:   Last seen for this 3 months ago.  Management since that visit includes none.  Last Lipid Panel:    Component Value Date/Time   CHOL 170 12/13/2014 1211   CHOL 178 05/10/2014   TRIG 156* 12/13/2014 1211   HDL 37* 12/13/2014 1211   HDL 35 05/10/2014   CHOLHDL 4.6 12/13/2014 1211   LDLCALC 102* 12/13/2014 1211   LDLCALC 103 05/10/2014    He reports good compliance with treatment. He is not having side effects.   Wt Readings from Last 3 Encounters:  06/17/15 171 lb 6.4 oz (77.747 kg)  03/25/15 172 lb 3.2 oz (78.109 kg)  12/10/14 170 lb 9.6 oz (77.384 kg)    ------------------------------------------------------------------------   Patient Active Problem  List   Diagnosis Date Noted  . Abnormal blood chemistry 06/27/2014  . Adjustment disorder with mixed anxiety and depressed mood 06/27/2014  . Anxiety disorder 06/27/2014  . Chest pain on exertion 06/27/2014  . COPD, mild (HCC) 06/27/2014  . Acid indigestion 06/27/2014  . Can't get food down 06/27/2014  . Breath shortness 06/27/2014  . Herpes zona 06/27/2014  . History of digestive disease 06/27/2014  . Hydrocele 06/27/2014  . HLD (hyperlipidemia) 06/27/2014  . Benign hypertension 06/27/2014  . BP (high blood pressure) 06/27/2014  . 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 Cerumen impaction 06/27/2014  . Pruritic dermatitis 06/27/2014  . Restless leg 06/27/2014  . 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 Unexplained weight loss 06/27/2014  . Inguinal hernia recurrent bilateral 08/28/2008  . 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 Malaise and fatigue 12/17/2006  . Accumulation of fluid in tissues 07/26/2006  . Essential (primary) hypertension 07/26/2006  . Tobacco use 07/26/2006   Past Surgical History  Procedure Laterality Date  . Hernia repair Bilateral 08/08/2010   Family History  Problem Relation Age of Onset  . Heart disease Mother    Previous Medications   ALBUTEROL (PROVENTIL HFA;VENTOLIN HFA) 108 (90 BASE) MCG/ACT INHALER    Inhale 2 puffs into the lungs 4 (four) times daily as needed.   B COMPLEX VITAMINS (VITAMIN B COMPLEX PO)    Take by mouth daily.   CLONAZEPAM (KLONOPIN) 0.5 MG TABLET    Take 1 tablet (0.5 mg total) by mouth at bedtime.   FENOFIBRATE 160  MG TABLET    Take 1 tablet (160 mg total) by mouth daily.   FLUTICASONE-SALMETEROL    1 Inhaler 2 (two) times daily.   LISINOPRIL-HYDROCHLOROTHIAZIDE (PRINZIDE,ZESTORETIC) 10-12.5 MG TABLET    Take 1 tablet by mouth daily.   OMEGA 3 1000 MG CAPS    Take 1 capsule by mouth daily.   OMEPRAZOLE (PRILOSEC) 40 MG CAPSULE    Take 1 capsule by mouth daily.   SERTRALINE (ZOLOFT) 50 MG TABLET    Take 1 tablet (50 mg total) by mouth at  bedtime.   No Known Allergies  Review of Systems  Constitutional: Negative.   HENT: Negative.   Eyes: Negative.   Respiratory: Negative.        Slight wheezing occasionally but not to the point of needing the Albuterol yet.  Cardiovascular: Negative.   Gastrointestinal: Negative.   Endocrine: Negative.   Genitourinary: Negative.   Musculoskeletal: Negative.   Skin: Negative.   Allergic/Immunologic: Negative.   Neurological: Negative.   Hematological: Negative.   Psychiatric/Behavioral: The patient is nervous/anxious.        Occurs occasionally with stress caring for wife with dementia.    Social History  Substance Use Topics  . Smoking status: Former Games developermoker  . Smokeless tobacco: Not on file  . Alcohol Use: No     Comment: stopped in 2012   Objective:   BP 142/72 mmHg  Pulse 80  Temp(Src) 97.4 F (36.3 C) (Oral)  Resp 16  Wt 171 lb 6.4 oz (77.747 kg) Body mass index is 26.25 kg/(m^2).   Physical Exam  Constitutional: He is oriented to person, place, and time. He appears well-developed and well-nourished.  HENT:  Head: Normocephalic.  Right Ear: External ear normal.  Left Ear: External ear normal.  Nose: Nose normal.  Mouth/Throat: Oropharynx is clear and moist.  Diminished hearing.  Eyes: Conjunctivae and EOM are normal.  Neck: Neck supple. No thyromegaly present.  Cardiovascular: Normal rate, regular rhythm and normal heart sounds.   Pulmonary/Chest: Effort normal. No respiratory distress.  Questionable one rhonchus that clears with a deep breath.  Abdominal: Soft. Bowel sounds are normal.  Lymphadenopathy:    He has no cervical adenopathy.  Neurological: He is alert and oriented to person, place, and time.  Psychiatric: His speech is normal and behavior is normal. Thought content normal. His mood appears anxious.      Assessment & Plan:     1. Anxiety disorder, unspecified anxiety disorder type Stable and well controlled with the Zoloft 50 mg hs and  Clonazepam 0.5 mg hs. No hangover or daytime sleepiness. Continue present dosage and recheck prn. - TSH  2. Restless leg Well controlled with use of Clonazepam at bedtime. No further problems during the day.  3. Essential (primary) hypertension Good control of BP and good tolerance of the Lisinopril/HCTZ 10/12.5 mg qd. Recheck routine labs and follow up for CPE in 4-5 months. - CBC with Differential/Platelet - Comprehensive metabolic panel  4. HLD (hyperlipidemia) Tolerating Fenofibrate 160 mg qd without side effects. Weight stable and good appetite. Will continue low fat diet and recheck lipids. - Comprehensive metabolic panel - Lipid panel - TSH

## 2015-06-20 DIAGNOSIS — E785 Hyperlipidemia, unspecified: Secondary | ICD-10-CM | POA: Diagnosis not present

## 2015-06-20 DIAGNOSIS — F419 Anxiety disorder, unspecified: Secondary | ICD-10-CM | POA: Diagnosis not present

## 2015-06-20 DIAGNOSIS — I10 Essential (primary) hypertension: Secondary | ICD-10-CM | POA: Diagnosis not present

## 2015-06-21 ENCOUNTER — Telehealth: Payer: Self-pay

## 2015-06-21 LAB — CBC WITH DIFFERENTIAL/PLATELET
BASOS ABS: 0.1 10*3/uL (ref 0.0–0.2)
Basos: 1 %
EOS (ABSOLUTE): 0.5 10*3/uL — AB (ref 0.0–0.4)
Eos: 6 %
HEMOGLOBIN: 15 g/dL (ref 12.6–17.7)
Hematocrit: 45.7 % (ref 37.5–51.0)
IMMATURE GRANS (ABS): 0 10*3/uL (ref 0.0–0.1)
IMMATURE GRANULOCYTES: 0 %
LYMPHS: 35 %
Lymphocytes Absolute: 2.8 10*3/uL (ref 0.7–3.1)
MCH: 31.4 pg (ref 26.6–33.0)
MCHC: 32.8 g/dL (ref 31.5–35.7)
MCV: 96 fL (ref 79–97)
MONOCYTES: 6 %
Monocytes Absolute: 0.5 10*3/uL (ref 0.1–0.9)
NEUTROS PCT: 52 %
Neutrophils Absolute: 4.1 10*3/uL (ref 1.4–7.0)
PLATELETS: 359 10*3/uL (ref 150–379)
RBC: 4.78 x10E6/uL (ref 4.14–5.80)
RDW: 14.1 % (ref 12.3–15.4)
WBC: 8 10*3/uL (ref 3.4–10.8)

## 2015-06-21 LAB — COMPREHENSIVE METABOLIC PANEL
ALBUMIN: 4.5 g/dL (ref 3.5–4.8)
ALT: 11 IU/L (ref 0–44)
AST: 22 IU/L (ref 0–40)
Albumin/Globulin Ratio: 1.9 (ref 1.2–2.2)
Alkaline Phosphatase: 49 IU/L (ref 39–117)
BILIRUBIN TOTAL: 0.6 mg/dL (ref 0.0–1.2)
BUN/Creatinine Ratio: 15 (ref 10–24)
BUN: 20 mg/dL (ref 8–27)
CALCIUM: 9.6 mg/dL (ref 8.6–10.2)
CHLORIDE: 99 mmol/L (ref 96–106)
CO2: 27 mmol/L (ref 18–29)
CREATININE: 1.31 mg/dL — AB (ref 0.76–1.27)
GFR, EST AFRICAN AMERICAN: 61 mL/min/{1.73_m2} (ref >59)
GFR, EST NON AFRICAN AMERICAN: 53 mL/min/{1.73_m2} — AB (ref >59)
GLUCOSE: 94 mg/dL (ref 65–99)
Globulin, Total: 2.4 g/dL (ref 1.5–4.5)
Potassium: 5.3 mmol/L — ABNORMAL HIGH (ref 3.5–5.2)
Sodium: 141 mmol/L (ref 134–144)
TOTAL PROTEIN: 6.9 g/dL (ref 6.0–8.5)

## 2015-06-21 LAB — LIPID PANEL
Chol/HDL Ratio: 5.2 ratio units — ABNORMAL HIGH (ref 0.0–5.0)
Cholesterol, Total: 187 mg/dL (ref 100–199)
HDL: 36 mg/dL — AB (ref >39)
LDL Calculated: 117 mg/dL — ABNORMAL HIGH (ref 0–99)
Triglycerides: 172 mg/dL — ABNORMAL HIGH (ref 0–149)
VLDL Cholesterol Cal: 34 mg/dL (ref 5–40)

## 2015-06-21 LAB — TSH: TSH: 3 u[IU]/mL (ref 0.450–4.500)

## 2015-06-21 NOTE — Telephone Encounter (Signed)
-----   Message from Tamsen Roersennis E Chrismon, [place] sent at 06/21/2015 10:37 AM EDT ----- No anemia or signs of infection on CBC. Some elevation of eosinophils (ususally elevated with allergies). Kidney function tests are better but potassium still a little up. Need to drink more water. Triglycerides and LDL cholesterol sneaking back up. Need to be sure to follow the low fat diet and take the Fenofibrate daily. Could add Metamucil 1 tsp  In 4-6 ounces of juice or water BID and recheck levels in 3-4 months.

## 2015-06-21 NOTE — Telephone Encounter (Signed)
Left message to call back  

## 2015-06-21 NOTE — Telephone Encounter (Signed)
Patient advised as directed below. Patient verbalized understanding.  

## 2015-06-24 ENCOUNTER — Telehealth: Payer: Self-pay | Admitting: Family Medicine

## 2015-06-24 NOTE — Telephone Encounter (Signed)
Can schedule appointment to change BP medication. Have to reassess lungs with his history of smoking and COPD on chest x-rays. At his last office visit, he reported some wheezing occasionally, but, had not been using his Albuterol inhaler. There was some evidence of a mucus plug on auscultation that dislodged with a cough.

## 2015-06-24 NOTE — Telephone Encounter (Signed)
Raynelle FanningJulie, CNP  ([facility]) for Andrew Hall's wife came in to see Mrs. Christell ConstantMoore today and notice that Mr. Christell ConstantMoore is coughing a lot and she said it is the ACE induced cough and would like for you to change his BP meds to something other than an ACE.

## 2015-06-27 NOTE — Telephone Encounter (Signed)
Please call patient

## 2015-06-28 NOTE — Telephone Encounter (Signed)
Patient advised as directed below. Patient has a appointment scheduled for 07/01/2015.

## 2015-07-01 ENCOUNTER — Encounter: Payer: Self-pay | Admitting: Family Medicine

## 2015-07-01 ENCOUNTER — Ambulatory Visit (INDEPENDENT_AMBULATORY_CARE_PROVIDER_SITE_OTHER): Payer: Medicare PPO | Admitting: Family Medicine

## 2015-07-01 VITALS — BP 142/88 | HR 90 | Temp 97.7°F | Resp 16 | Wt 172.8 lb

## 2015-07-01 DIAGNOSIS — R05 Cough: Secondary | ICD-10-CM

## 2015-07-01 DIAGNOSIS — I10 Essential (primary) hypertension: Secondary | ICD-10-CM | POA: Diagnosis not present

## 2015-07-01 DIAGNOSIS — R053 Chronic cough: Secondary | ICD-10-CM

## 2015-07-01 MED ORDER — METOPROLOL SUCCINATE ER 50 MG PO TB24: 50.0000 mg | ORAL_TABLET | Freq: Every day | ORAL | Status: DC

## 2015-07-01 MED ORDER — ALBUTEROL SULFATE HFA 108 (90 BASE) MCG/ACT IN AERS: 2.0000 | INHALATION_SPRAY | Freq: Four times a day (QID) | RESPIRATORY_TRACT | Status: DC | PRN

## 2015-07-01 NOTE — Progress Notes (Signed)
Patient ID: Andrew LinCurtis W Baccam, male   DOB: 1941/02/05, 75 y.o.   MRN: [id number]   Patient: Andrew Hall Male    DOB: 1941/02/05   75 y.o.   MRN: [id number] Visit Date: 07/01/2015  Today's Provider: Dortha Kernennis Chrismon, PA   Chief Complaint  Patient presents with  . Cough  . Wheezing   Subjective:    Cough The current episode started more than 1 month ago. The problem has been unchanged. The cough is productive of sputum. Associated symptoms include wheezing.  Wheezing  The current episode started more than 1 month ago. The problem occurs constantly. The problem has been unchanged. Associated symptoms include coughing.  States [employer] CNP ([facility]) had come by to check on his wife and noticed he was coughing. She reviewed his medications and sent request to change BP med from the Lisinopril/HCTZ to some thing else. Still has not refilled his Albuterol or Advair for COPD with bronchospasms.  Patient Active Problem List   Diagnosis Date Noted  . Abnormal blood chemistry 06/27/2014  . Adjustment disorder with mixed anxiety and depressed mood 06/27/2014  . Anxiety disorder 06/27/2014  . Chest pain on exertion 06/27/2014  . COPD, mild (HCC) 06/27/2014  . Acid indigestion 06/27/2014  . Can't get food down 06/27/2014  . Breath shortness 06/27/2014  . Herpes zona 06/27/2014  . History of digestive disease 06/27/2014  . Hydrocele 06/27/2014  . HLD (hyperlipidemia) 06/27/2014  . Benign hypertension 06/27/2014  . BP (high blood pressure) 06/27/2014  . 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 Cerumen impaction 06/27/2014  . Pruritic dermatitis 06/27/2014  . Restless leg 06/27/2014  . 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 Unexplained weight loss 06/27/2014  . Inguinal hernia recurrent bilateral 08/28/2008  . 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 Malaise and fatigue 12/17/2006  . Accumulation of fluid in tissues 07/26/2006  . Essential (primary) hypertension 07/26/2006  . Tobacco use  07/26/2006   Past Surgical History  Procedure Laterality Date  . Hernia repair Bilateral 08/08/2010   Family History  Problem Relation Age of Onset  . Heart disease Mother    Previous Medications   ALBUTEROL (PROVENTIL HFA;VENTOLIN HFA) 108 (90 BASE) MCG/ACT INHALER    Inhale 2 puffs into the lungs 4 (four) times daily as needed.   B COMPLEX VITAMINS (VITAMIN B COMPLEX PO)    Take by mouth daily.   CLONAZEPAM (KLONOPIN) 0.5 MG TABLET    Take 1 tablet (0.5 mg total) by mouth at bedtime.   FENOFIBRATE 160 MG TABLET    Take 1 tablet (160 mg total) by mouth daily.   FLUTICASONE-SALMETEROL    1 Inhaler 2 (two) times daily.   LISINOPRIL-HYDROCHLOROTHIAZIDE (PRINZIDE,ZESTORETIC) 10-12.5 MG TABLET    Take 1 tablet by mouth daily.   OMEGA 3 1000 MG CAPS    Take 1 capsule by mouth daily.   OMEPRAZOLE (PRILOSEC) 40 MG CAPSULE    Take 1 capsule by mouth daily.   SERTRALINE (ZOLOFT) 50 MG TABLET    Take 1 tablet (50 mg total) by mouth at bedtime.   No Known Allergies  Review of Systems  Constitutional: Negative.   HENT: Negative.   Eyes: Negative.   Respiratory: Positive for cough and wheezing.   Cardiovascular: Negative.   Gastrointestinal: Negative.   Endocrine: Negative.   Genitourinary: Negative.   Musculoskeletal: Negative.   Skin: Negative.   Allergic/Immunologic: Negative.   Neurological: Negative.   Hematological: Negative.   Psychiatric/Behavioral: Negative.     Social  History  Substance Use Topics  . Smoking status: Former Games developer  . Smokeless tobacco: Not on file  . Alcohol Use: No     Comment: stopped in 2012   Objective:   BP 142/88 mmHg  Pulse 90  Temp(Src) 97.7 F (36.5 C) (Oral)  Resp 16  Wt 172 lb 12.8 oz (78.382 kg)  SpO2 97% BP Readings from Last 3 Encounters:  07/01/15 142/88  06/17/15 142/72  03/25/15 118/78     Physical Exam  Constitutional: He is oriented to person, place, and time. He appears well-developed and well-nourished.  HENT:  Head:  Normocephalic.  Hearing loss moderate.  Eyes: Conjunctivae and EOM are normal.  Neck: Neck supple.  Cardiovascular: Normal rate and regular rhythm.   Pulmonary/Chest: Breath sounds normal. He has no wheezes. He has no rales.  Abdominal: Soft. Bowel sounds are normal.  Neurological: He is alert and oriented to person, place, and time.      Assessment & Plan:     1. Essential (primary) hypertension BP stable control. Visiting CNP from insurance company felt little hacky cough was due to ACE inhibitor. Will may switch to beta blocker and stop the Lisinopril/HCTZ. Recheck in the office in 2 weeks. - metoprolol succinate (TOPROL-XL) 50 MG 24 hr tablet; Take 1 tablet (50 mg total) by mouth daily. Take with or immediately following a meal.  Dispense: 90 tablet; Refill: 3  2. Chronic cough Has a history of COPD and has not been using the Albuterol or Advair recently. Refilled Proventil (feels he can't afford both the Advair and the Albuterol now). Prescription written and recheck in 2 weeks. - albuterol (PROVENTIL HFA;VENTOLIN HFA) 108 (90 Base) MCG/ACT inhaler; Inhale 2 puffs into the lungs 4 (four) times daily as needed.  Dispense: 18 g; Refill: 2

## 2015-07-02 DIAGNOSIS — Z87891 Personal history of nicotine dependence: Secondary | ICD-10-CM | POA: Diagnosis not present

## 2015-07-02 DIAGNOSIS — J449 Chronic obstructive pulmonary disease, unspecified: Secondary | ICD-10-CM | POA: Diagnosis not present

## 2015-07-02 DIAGNOSIS — Z6826 Body mass index (BMI) 26.0-26.9, adult: Secondary | ICD-10-CM | POA: Diagnosis not present

## 2015-07-02 DIAGNOSIS — E785 Hyperlipidemia, unspecified: Secondary | ICD-10-CM | POA: Diagnosis not present

## 2015-07-02 DIAGNOSIS — F418 Other specified anxiety disorders: Secondary | ICD-10-CM | POA: Diagnosis not present

## 2015-07-15 ENCOUNTER — Ambulatory Visit (INDEPENDENT_AMBULATORY_CARE_PROVIDER_SITE_OTHER): Payer: Medicare PPO | Admitting: Family Medicine

## 2015-07-15 ENCOUNTER — Encounter: Payer: Self-pay | Admitting: Family Medicine

## 2015-07-15 VITALS — BP 140/82 | HR 80 | Temp 98.0°F | Resp 18 | Wt 174.0 lb

## 2015-07-15 DIAGNOSIS — I10 Essential (primary) hypertension: Secondary | ICD-10-CM

## 2015-07-15 DIAGNOSIS — J449 Chronic obstructive pulmonary disease, unspecified: Secondary | ICD-10-CM

## 2015-07-15 MED ORDER — UMECLIDINIUM BROMIDE 62.5 MCG/INH IN AEPB: 1.0000 | INHALATION_SPRAY | Freq: Every day | RESPIRATORY_TRACT | Status: DC

## 2015-07-15 NOTE — Progress Notes (Signed)
Patient ID: Andrew Hall, male   DOB: February 28, 1940, 75 y.o.   MRN: [id number]       Patient: Andrew Hall Male    DOB: 01-Mar-1940   75 y.o.   MRN: [id number] Visit Date: 07/15/2015  Today's Provider: Dortha Kern, PA   Chief Complaint  Patient presents with  . Hypertension  . Cough   Subjective:    HPI  Patient is here for follow up after his last visit on May 8th. At that time Lisinopril/HCTZ was stopped due to this medication could possibly have made patient have on going cough. Started Metoprolol in place of the discontinued medication. Refill for Proventil inhaler was done also. Patient states that the cough has gotten better since switching medication. Patient does not check his B/P.  BP Readings from Last 3 Encounters:  07/15/15 140/82  07/01/15 142/88  06/17/15 142/72    No Known Allergies   Previous Medications   ALBUTEROL (PROVENTIL HFA;VENTOLIN HFA) 108 (90 BASE) MCG/ACT INHALER    Inhale 2 puffs into the lungs 4 (four) times daily as needed.   B COMPLEX VITAMINS (VITAMIN B COMPLEX PO)    Take by mouth daily.   CLONAZEPAM (KLONOPIN) 0.5 MG TABLET    Take 1 tablet (0.5 mg total) by mouth at bedtime.   FENOFIBRATE 160 MG TABLET    Take 1 tablet (160 mg total) by mouth daily.   FLUTICASONE-SALMETEROL    1 Inhaler 2 (two) times daily.   METOPROLOL SUCCINATE (TOPROL-XL) 50 MG 24 HR TABLET    Take 1 tablet (50 mg total) by mouth daily. Take with or immediately following a meal.   OMEGA 3 1000 MG CAPS    Take 1 capsule by mouth daily.   OMEPRAZOLE (PRILOSEC) 40 MG CAPSULE    Take 1 capsule by mouth daily.   SERTRALINE (ZOLOFT) 50 MG TABLET    Take 1 tablet (50 mg total) by mouth at bedtime.    Review of Systems  Constitutional: Positive for fatigue.  HENT: Positive for congestion, postnasal drip, rhinorrhea and sneezing.   Respiratory: Positive for cough and shortness of breath.   Cardiovascular: Negative.   Musculoskeletal: Positive for myalgias (at times) and  back pain (at  times).  Neurological: Positive for dizziness and light-headedness. Negative for headaches.    Social History  Substance Use Topics  . Smoking status: Former Games developer  . Smokeless tobacco: Not on file  . Alcohol Use: No     Comment: stopped in 2012   Objective:   BP 140/82 mmHg  Pulse 80  Temp(Src) 98 F (36.7 C)  Resp 18  Wt 174 lb (78.926 kg)  SpO2 96% Body mass index is 26.65 kg/(m^2).  Physical Exam  Constitutional: He is oriented to person, place, and time. He appears well-developed and well-nourished. No distress.  HENT:  Head: Normocephalic and atraumatic.  Right Ear: Hearing normal.  Left Ear: Hearing normal.  Nose: Nose normal.  Eyes: Conjunctivae and lids are normal. Right eye exhibits no discharge. Left eye exhibits no discharge. No scleral icterus.  Neck: Neck supple.  Cardiovascular: Normal rate and regular rhythm.   Pulmonary/Chest: Effort normal. No respiratory distress.  Distant breath sounds with a few rhonchi.  Musculoskeletal: Normal range of motion.  Neurological: He is alert and oriented to person, place, and time.  Skin: Skin is intact. No lesion and no rash noted.  Psychiatric: He has a normal mood and affect. His speech is normal and behavior is normal. Thought content  normal.      Assessment & Plan:     1. Essential (primary) hypertension Feeling better and less cough since change from ACE inhibitor to Metoprolol. Will continue present dosage and recheck in 4 months.  2. COPD, mild (HCC) Stable on the Albuterol prn. Still has some persistent cough and heavy white sputum production in the early morning and evenings. May add Claritin if having any sneezing or post nasal drip. Given Incruse Ellipta once a day and recheck prn.       Dortha Kernennis Richards Pherigo, PA  [facility]

## 2015-08-06 ENCOUNTER — Other Ambulatory Visit: Payer: Self-pay | Admitting: Family Medicine

## 2015-08-06 DIAGNOSIS — G2581 Restless legs syndrome: Secondary | ICD-10-CM

## 2015-08-06 DIAGNOSIS — F419 Anxiety disorder, unspecified: Secondary | ICD-10-CM

## 2015-08-06 MED ORDER — CLONAZEPAM 0.5 MG PO TABS: 0.5000 mg | ORAL_TABLET | Freq: Every day | ORAL | Status: DC

## 2015-08-06 NOTE — Telephone Encounter (Signed)
Phone in refill of Clonazepam to [facility] [address]

## 2015-08-06 NOTE — Telephone Encounter (Signed)
clonazePAM (KLONOPIN) 0.5 MG tablet   [facility] [address]

## 2015-08-07 ENCOUNTER — Other Ambulatory Visit: Payer: Self-pay | Admitting: Family Medicine

## 2015-08-07 NOTE — Telephone Encounter (Signed)
Done-aa 

## 2015-08-07 NOTE — Telephone Encounter (Signed)
Pt called the pharmacy regarding his Klonopin.  [facility] told him they do not have an order for it.  He would like someone to send it in today/   Thanks, teri

## 2015-08-08 NOTE — Telephone Encounter (Signed)
Check to see if Klonopin refill phoned in on 08-06-15. If not, [facility] [address]

## 2015-08-20 NOTE — Telephone Encounter (Signed)
i called it in on 08/06/15-aa

## 2015-08-26 ENCOUNTER — Ambulatory Visit (INDEPENDENT_AMBULATORY_CARE_PROVIDER_SITE_OTHER): Payer: Medicare PPO | Admitting: Family Medicine

## 2015-08-26 ENCOUNTER — Encounter: Payer: Self-pay | Admitting: Family Medicine

## 2015-08-26 VITALS — BP 148/72 | HR 88 | Temp 98.2°F | Resp 16 | Wt 172.0 lb

## 2015-08-26 DIAGNOSIS — G2581 Restless legs syndrome: Secondary | ICD-10-CM | POA: Diagnosis not present

## 2015-08-26 DIAGNOSIS — J449 Chronic obstructive pulmonary disease, unspecified: Secondary | ICD-10-CM | POA: Diagnosis not present

## 2015-08-26 DIAGNOSIS — R6883 Chills (without fever): Secondary | ICD-10-CM | POA: Diagnosis not present

## 2015-08-26 DIAGNOSIS — I10 Essential (primary) hypertension: Secondary | ICD-10-CM | POA: Diagnosis not present

## 2015-08-26 NOTE — Progress Notes (Signed)
Patient: Andrew Hall Male    DOB: 06-Nov-1940   75 y.o.   MRN: [id number] Visit Date: 08/26/2015  Today's Provider: Dortha Kernennis Chrismon, PA   Chief Complaint  Patient presents with  . Headache   Subjective:    Headache  This is a new problem. The current episode started in the past 7 days. The problem occurs intermittently. The problem has been gradually worsening. The pain does not radiate. The quality of the pain is described as dull and aching. The pain is mild. Associated symptoms include a loss of balance, nausea and weakness.  Patient feels like his symptoms are caused by the Metoprolol. He reports that he has had excessive shaking, muscle cramps, feeling off balance, and nausea since starting the medication. Patient wants to discuss coming off of the medication.    Patient Active Problem List   Diagnosis Date Noted  . Abnormal blood chemistry 06/27/2014  . Adjustment disorder with mixed anxiety and depressed mood 06/27/2014  . Anxiety disorder 06/27/2014  . Chest pain on exertion 06/27/2014  . COPD, mild (HCC) 06/27/2014  . Acid indigestion 06/27/2014  . Can't get food down 06/27/2014  . Breath shortness 06/27/2014  . Herpes zona 06/27/2014  . History of digestive disease 06/27/2014  . Hydrocele 06/27/2014  . HLD (hyperlipidemia) 06/27/2014  . Benign hypertension 06/27/2014  . BP (high blood pressure) 06/27/2014  . 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 Cerumen impaction 06/27/2014  . Pruritic dermatitis 06/27/2014  . Restless leg 06/27/2014  . 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 Unexplained weight loss 06/27/2014  . Inguinal hernia recurrent bilateral 08/28/2008  . 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 Malaise and fatigue 12/17/2006  . Accumulation of fluid in tissues 07/26/2006  . Essential (primary) hypertension 07/26/2006  . Tobacco use 07/26/2006   Past Surgical History  Procedure Laterality Date  . Hernia repair Bilateral 08/08/2010   Family History  Problem  Relation Age of Onset  . Heart disease Mother    No Known Allergies   Current Meds  Medication Sig  . albuterol (PROVENTIL HFA;VENTOLIN HFA) 108 (90 Base) MCG/ACT inhaler Inhale 2 puffs into the lungs 4 (four) times daily as needed.  . B Complex Vitamins (VITAMIN B COMPLEX PO) Take by mouth daily.  . clonazePAM (KLONOPIN) 0.5 MG tablet TAKE ONE TABLET BY MOUTH AT BEDTIME  . fenofibrate 160 MG tablet Take 1 tablet (160 mg total) by mouth daily.  Marland Kitchen. FLUTICASONE-SALMETEROL 1 Inhaler 2 (two) times daily.  . metoprolol succinate (TOPROL-XL) 50 MG 24 hr tablet Take 1 tablet (50 mg total) by mouth daily. Take with or immediately following a meal.  . Omega 3 1000 MG CAPS Take 1 capsule by mouth daily.  Marland Kitchen. omeprazole (PRILOSEC) 40 MG capsule Take 1 capsule by mouth daily.  . sertraline (ZOLOFT) 50 MG tablet Take 1 tablet (50 mg total) by mouth at bedtime.  Marland Kitchen. umeclidinium bromide (INCRUSE ELLIPTA) 62.5 MCG/INH AEPB Inhale 1 puff into the lungs daily.   Review of Systems  Gastrointestinal: Positive for nausea.  Neurological: Positive for weakness, headaches and loss of balance.   Social History  Substance Use Topics  . Smoking status: Former Games developermoker  . Smokeless tobacco: Not on file  . Alcohol Use: No     Comment: stopped in 2012   Objective:   BP 148/72 mmHg  Pulse 88  Temp(Src) 98.2 F (36.8 C)  Resp 16  Wt 172 lb (78.019 kg)  Physical Exam  Constitutional: He is  oriented to person, place, and time. He appears well-developed and well-nourished.  HENT:  Head: Normocephalic.  Nose: Nose normal.  Diminished hearing bilaterally.  Eyes: Conjunctivae are normal.  Neck: Neck supple. No thyromegaly present.  Cardiovascular: Normal rate and regular rhythm.   Pulmonary/Chest: Effort normal and breath sounds normal.  Abdominal: Soft. Bowel sounds are normal.  Lymphadenopathy:    He has no cervical adenopathy.  Neurological: He is alert and oriented to person, place, and time. He has normal  reflexes.  No muscle weakness.      Assessment & Plan:     1. Chills (without fever) Onset with headache but without fever the past 7 days. No cough, congestion or body aches. Will check labs to rule out infection or metabolic disorder. Has had some stress with caring for his wife who has Alzheimer's Disease at an advanced stage. No headache or shaking today. Recheck pending reports. - CBC with Differential/Platelet - Comprehensive metabolic panel - TSH  2. Restless leg Well controlled by use of Klonopin 0.5 mg at bedtime. No daytime sleepiness and it helps to control anxiety level along with Sertraline 50 mg HS.. - CBC with Differential/Platelet - Comprehensive metabolic panel - TSH  3. COPD, mild (HCC) No longer smoking. No recent increase in cough or sputum production. Still using Advair and Incruse with prn Proventil to control dyspnea and chronic bronchitis. Recheck routine labs. - CBC with Differential/Platelet  4. Essential (primary) hypertension Stable and well controlled by Metoprolol-XL 50 mg qd. If headaches recur, will need to check BP during headaches. Metoprolol has helped to control some of his benign tremors in the past. Recheck pending lab reports. - Comprehensive metabolic panel - TSH       Dortha Kernennis Chrismon, PA  [facility]

## 2015-08-27 LAB — COMPREHENSIVE METABOLIC PANEL
A/G RATIO: 1.4 (ref 1.2–2.2)
ALBUMIN: 4 g/dL (ref 3.5–4.8)
ALT: 9 IU/L (ref 0–44)
AST: 19 IU/L (ref 0–40)
Alkaline Phosphatase: 50 IU/L (ref 39–117)
BILIRUBIN TOTAL: 0.4 mg/dL (ref 0.0–1.2)
BUN / CREAT RATIO: 16 (ref 10–24)
BUN: 19 mg/dL (ref 8–27)
CHLORIDE: 96 mmol/L (ref 96–106)
CO2: 29 mmol/L (ref 18–29)
Calcium: 9.1 mg/dL (ref 8.6–10.2)
Creatinine, Ser: 1.2 mg/dL (ref 0.76–1.27)
GFR calc non Af Amer: 59 mL/min/{1.73_m2} — ABNORMAL LOW (ref >59)
GFR, EST AFRICAN AMERICAN: 68 mL/min/{1.73_m2} (ref >59)
Globulin, Total: 2.9 g/dL (ref 1.5–4.5)
Glucose: 94 mg/dL (ref 65–99)
POTASSIUM: 4.5 mmol/L (ref 3.5–5.2)
Sodium: 144 mmol/L (ref 134–144)
TOTAL PROTEIN: 6.9 g/dL (ref 6.0–8.5)

## 2015-08-27 LAB — CBC WITH DIFFERENTIAL/PLATELET
BASOS: 1 %
Basophils Absolute: 0.1 10*3/uL (ref 0.0–0.2)
EOS (ABSOLUTE): 0.3 10*3/uL (ref 0.0–0.4)
Eos: 4 %
HEMOGLOBIN: 14.6 g/dL (ref 12.6–17.7)
Hematocrit: 43.9 % (ref 37.5–51.0)
IMMATURE GRANS (ABS): 0 10*3/uL (ref 0.0–0.1)
Immature Granulocytes: 0 %
LYMPHS ABS: 1.9 10*3/uL (ref 0.7–3.1)
LYMPHS: 24 %
MCH: 31.7 pg (ref 26.6–33.0)
MCHC: 33.3 g/dL (ref 31.5–35.7)
MCV: 95 fL (ref 79–97)
Monocytes Absolute: 0.7 10*3/uL (ref 0.1–0.9)
Monocytes: 9 %
NEUTROS ABS: 5 10*3/uL (ref 1.4–7.0)
Neutrophils: 62 %
PLATELETS: 329 10*3/uL (ref 150–379)
RBC: 4.61 x10E6/uL (ref 4.14–5.80)
RDW: 13.8 % (ref 12.3–15.4)
WBC: 8 10*3/uL (ref 3.4–10.8)

## 2015-08-27 LAB — TSH: TSH: 1.79 u[IU]/mL (ref 0.450–4.500)

## 2015-08-30 ENCOUNTER — Telehealth: Payer: Self-pay

## 2015-08-30 NOTE — Telephone Encounter (Signed)
Advised patient as below.  

## 2015-08-30 NOTE — Telephone Encounter (Signed)
-----   Message from Tamsen Roersennis E Chrismon, [place] sent at 08/29/2015  9:57 AM EDT ----- All blood tests normal. Continue present medications and recheck in 3 months.

## 2015-08-30 NOTE — Telephone Encounter (Signed)
LMTCB. sd  

## 2015-09-09 ENCOUNTER — Telehealth: Payer: Self-pay | Admitting: Family Medicine

## 2015-09-09 NOTE — Telephone Encounter (Signed)
Seems Metoprolol was really helping BP.

## 2015-09-09 NOTE — Telephone Encounter (Signed)
Pt called and stated that he was advised on 08/26/15 to stop taking his metoprolol succinate (TOPROL-XL) 50 MG 24 hr tablet. Pt stated that his BP has been elevated and today it is 184/95. Pt stated no chest pains but does have a little headache. I advised that I didn't see notation for him to come off the medications and I would send a message for a nurse to return his call. Please advise. Thanks TNP

## 2015-09-09 NOTE — Telephone Encounter (Signed)
During OV on 08/26/2015 patient had complaints of headache, night sweats, and excessive tremors and believed that the Metoprolol was causing his symptoms. Patient told you that he wanted to discontinue the medication. It was recommended that he monitor his BP if he wanted to stay off of the medication.

## 2015-09-09 NOTE — Telephone Encounter (Signed)
Seems the Metoprolol was really helping the BP. Has he had any of the tremor or night sweats go away since he has stopped the Metoprolol? If no improvement, the Metoprolol probably not the reason for the symptoms. If getting improvement in symptoms, will need to consider a different medication for the BP - such as Amlodipine 5 mg qd #30 and schedule recheck of BP in 2 weeks.

## 2015-09-10 ENCOUNTER — Telehealth: Payer: Self-pay | Admitting: Family Medicine

## 2015-09-10 MED ORDER — AMLODIPINE BESYLATE 5 MG PO TABS: 5.0000 mg | ORAL_TABLET | Freq: Every day | ORAL | Status: DC

## 2015-09-10 NOTE — Telephone Encounter (Signed)
Advised patient as below. Patient reports that he has not had any tremors since stopping metoprolol. Patient is wanting to start Amlodipine. Will send medication into [facility] on [address]. Per patient's request. Patient reports that he will call and make an appt in 2 weeks.

## 2015-09-10 NOTE — Telephone Encounter (Signed)
Left message to call back  

## 2015-09-10 NOTE — Telephone Encounter (Signed)
Pt is returning call/MW °

## 2015-09-10 NOTE — Telephone Encounter (Signed)
See previous message. Amlodipine was sent into patient's pharmacy and Metoprolol was discontinued off of medication list. Patient was advised.

## 2015-09-24 ENCOUNTER — Ambulatory Visit (INDEPENDENT_AMBULATORY_CARE_PROVIDER_SITE_OTHER): Payer: Medicare PPO | Admitting: Family Medicine

## 2015-09-24 ENCOUNTER — Encounter: Payer: Self-pay | Admitting: Family Medicine

## 2015-09-24 VITALS — BP 144/70 | HR 80 | Temp 98.3°F | Resp 16 | Wt 169.0 lb

## 2015-09-24 DIAGNOSIS — N433 Hydrocele, unspecified: Secondary | ICD-10-CM | POA: Diagnosis not present

## 2015-09-24 DIAGNOSIS — R350 Frequency of micturition: Secondary | ICD-10-CM | POA: Diagnosis not present

## 2015-09-24 DIAGNOSIS — I10 Essential (primary) hypertension: Secondary | ICD-10-CM | POA: Diagnosis not present

## 2015-09-24 LAB — POCT URINALYSIS DIPSTICK
Bilirubin, UA: NEGATIVE
GLUCOSE UA: NEGATIVE
Ketones, UA: NEGATIVE
NITRITE UA: POSITIVE
Spec Grav, UA: 1.01
UROBILINOGEN UA: 1
pH, UA: 7

## 2015-09-24 MED ORDER — SULFAMETHOXAZOLE-TRIMETHOPRIM 800-160 MG PO TABS: 1.0000 | ORAL_TABLET | Freq: Two times a day (BID) | ORAL | 0 refills | Status: DC

## 2015-09-24 NOTE — Progress Notes (Signed)
Patient: Andrew Hall Male    DOB: 17-Nov-1940   75 y.o.   MRN: [id number] Visit Date: 09/24/2015  Today's Provider: Dortha Kern, PA   Chief Complaint  Patient presents with  . Hypertension   Subjective:    HPI    Hypertension, follow-up:  BP Readings from Last 3 Encounters:  09/24/15 (!) 144/70  08/26/15 (!) 148/72  07/15/15 140/82    He was last seen for hypertension 1 months ago.  BP at that visit was 148/72. Management since that visit includes discontinuing Metoprolol and starting Amlodipine 5mg  daily. He reports good compliance with treatment. He is not having side effects.  He is not exercising. He is adherent to low salt diet.   Outside blood pressures are average 140s/70s. Patient denies chest pain, exertional chest pressure/discomfort, irregular heart beat, palpitations and tachypnea.    Weight trend: stable Wt Readings from Last 3 Encounters:  09/24/15 169 lb (76.7 kg)  08/26/15 172 lb (78 kg)  07/15/15 174 lb (78.9 kg)    Current diet: well balanced  No past medical history on file. Patient Active Problem List   Diagnosis Date Noted  . Abnormal blood chemistry 06/27/2014  . Adjustment disorder with mixed anxiety and depressed mood 06/27/2014  . Anxiety disorder 06/27/2014  . Chest pain on exertion 06/27/2014  . COPD, mild (HCC) 06/27/2014  . Acid indigestion 06/27/2014  . Can't get food down 06/27/2014  . Breath shortness 06/27/2014  . Herpes zona 06/27/2014  . History of digestive disease 06/27/2014  . Hydrocele 06/27/2014  . HLD (hyperlipidemia) 06/27/2014  . Benign hypertension 06/27/2014  . BP (high blood pressure) 06/27/2014  . 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 Cerumen impaction 06/27/2014  . Pruritic dermatitis 06/27/2014  . Restless leg 06/27/2014  . 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 Unexplained weight loss 06/27/2014  . Inguinal hernia recurrent bilateral 08/28/2008  . 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 Malaise and  fatigue 12/17/2006  . Accumulation of fluid in tissues 07/26/2006  . Essential (primary) hypertension 07/26/2006  . Tobacco use 07/26/2006   Past Surgical History:  Procedure Laterality Date  . HERNIA REPAIR Bilateral 08/08/2010   Family History  Problem Relation Age of Onset  . Heart disease Mother    No Known Allergies Current Meds  Medication Sig  . albuterol (PROVENTIL HFA;VENTOLIN HFA) 108 (90 Base) MCG/ACT inhaler Inhale 2 puffs into the lungs 4 (four) times daily as needed.  Marland Kitchen amLODipine (NORVASC) 5 MG tablet Take 1 tablet (5 mg total) by mouth daily.  . B Complex Vitamins (VITAMIN B COMPLEX PO) Take by mouth daily.  . clonazePAM (KLONOPIN) 0.5 MG tablet TAKE ONE TABLET BY MOUTH AT BEDTIME  . fenofibrate 160 MG tablet Take 1 tablet (160 mg total) by mouth daily.  Marland Kitchen FLUTICASONE-SALMETEROL 1 Inhaler 2 (two) times daily.  . Omega 3 1000 MG CAPS Take 1 capsule by mouth daily.  Marland Kitchen omeprazole (PRILOSEC) 40 MG capsule Take 1 capsule by mouth daily.  . sertraline (ZOLOFT) 50 MG tablet Take 1 tablet (50 mg total) by mouth at bedtime.  Marland Kitchen umeclidinium bromide (INCRUSE ELLIPTA) 62.5 MCG/INH AEPB Inhale 1 puff into the lungs daily.    Review of Systems  Constitutional: Negative for activity change, appetite change, chills, diaphoresis, fatigue, fever and unexpected weight change.  Respiratory: Negative.   Cardiovascular: Negative.   Neurological: Negative.     Social History  Substance Use Topics  . Smoking status: Former Games developer  . Smokeless tobacco:  Not on file  . Alcohol use No     Comment: stopped in 2012   Objective:   BP (!) 144/70 (BP Location: Left Arm, Patient Position: Sitting, Cuff Size: Normal)   Pulse 80   Temp 98.3 F (36.8 C)   Resp 16   Wt 169 lb (76.7 kg)   BMI 25.89 kg/m   Physical Exam  Constitutional: He is oriented to person, place, and time. He appears well-developed and well-nourished. No distress.  HENT:  Head: Normocephalic and atraumatic.    Right Ear: Hearing normal.  Left Ear: Hearing normal.  Nose: Nose normal.  Eyes: Conjunctivae and lids are normal. Right eye exhibits no discharge. Left eye exhibits no discharge. No scleral icterus.  Neck: Neck supple.  Cardiovascular: Normal rate and regular rhythm.   Pulmonary/Chest: Effort normal. No respiratory distress.  Abdominal: Soft. Bowel sounds are normal.  Genitourinary: Rectal exam shows guaiac negative stool.  Genitourinary Comments: Very large hydrocele (R>L) scrotum. Prostate 2+ enlarged.  Musculoskeletal: Normal range of motion.  Neurological: He is alert and oriented to person, place, and time.  Skin: Skin is intact. No lesion and no rash noted.  Psychiatric: He has a normal mood and affect. His speech is normal and behavior is normal. Thought content normal.      Assessment & Plan:     1. Essential (primary) hypertension Stable BP control since switching from Metoprolol to the Amlodipine. States he feels better except for urinary frequency. Will recheck renal and liver function. - Comprehensive metabolic panel  2. Urinary frequency Noticing an increase in urinary frequency and difficulty with urine flow. Urinalysis showed pyuria and will treat with Bactrim-DX BID and get routine labs. - PSA - Comprehensive metabolic panel - CBC with Differential/Platelet - POCT urinalysis dipstick - sulfamethoxazole-trimethoprim (BACTRIM DS,SEPTRA DS) 800-160 MG tablet; Take 1 tablet by mouth 2 (two) times daily.  Dispense: 20 tablet; Refill: 0 - Urine Culture  3. Hydrocele, unspecified hydrocele type Has a very large scrotum with a tense hydrocele (R>L). The size has caused difficulty voiding due to size covering penis. If no better flow with use of antibiotic, should consider recheck with urologist. May need surgical deflation.       Dortha Kern, PA  [facility]

## 2015-09-25 DIAGNOSIS — R350 Frequency of micturition: Secondary | ICD-10-CM | POA: Diagnosis not present

## 2015-09-27 DIAGNOSIS — I10 Essential (primary) hypertension: Secondary | ICD-10-CM | POA: Diagnosis not present

## 2015-09-27 DIAGNOSIS — R350 Frequency of micturition: Secondary | ICD-10-CM | POA: Diagnosis not present

## 2015-09-27 LAB — URINE CULTURE

## 2015-09-27 LAB — PLEASE NOTE

## 2015-09-28 LAB — COMPREHENSIVE METABOLIC PANEL
ALK PHOS: 50 IU/L (ref 39–117)
ALT: 6 IU/L (ref 0–44)
AST: 19 IU/L (ref 0–40)
Albumin/Globulin Ratio: 1.4 (ref 1.2–2.2)
Albumin: 4.1 g/dL (ref 3.5–4.8)
BILIRUBIN TOTAL: 0.5 mg/dL (ref 0.0–1.2)
BUN/Creatinine Ratio: 12 (ref 10–24)
BUN: 17 mg/dL (ref 8–27)
CHLORIDE: 100 mmol/L (ref 96–106)
CO2: 23 mmol/L (ref 18–29)
CREATININE: 1.44 mg/dL — AB (ref 0.76–1.27)
Calcium: 9.2 mg/dL (ref 8.6–10.2)
GFR calc Af Amer: 55 mL/min/{1.73_m2} — ABNORMAL LOW (ref >59)
GFR calc non Af Amer: 47 mL/min/{1.73_m2} — ABNORMAL LOW (ref >59)
Globulin, Total: 2.9 g/dL (ref 1.5–4.5)
Glucose: 96 mg/dL (ref 65–99)
Potassium: 5.3 mmol/L — ABNORMAL HIGH (ref 3.5–5.2)
SODIUM: 139 mmol/L (ref 134–144)
Total Protein: 7 g/dL (ref 6.0–8.5)

## 2015-09-28 LAB — CBC WITH DIFFERENTIAL/PLATELET
Basophils Absolute: 0.1 10*3/uL (ref 0.0–0.2)
Basos: 1 %
EOS (ABSOLUTE): 0.3 10*3/uL (ref 0.0–0.4)
EOS: 4 %
Hematocrit: 42.8 % (ref 37.5–51.0)
Hemoglobin: 14.2 g/dL (ref 12.6–17.7)
Immature Grans (Abs): 0 10*3/uL (ref 0.0–0.1)
Immature Granulocytes: 0 %
LYMPHS ABS: 2.2 10*3/uL (ref 0.7–3.1)
LYMPHS: 31 %
MCH: 31.6 pg (ref 26.6–33.0)
MCHC: 33.2 g/dL (ref 31.5–35.7)
MCV: 95 fL (ref 79–97)
MONOCYTES: 9 %
Monocytes Absolute: 0.7 10*3/uL (ref 0.1–0.9)
NEUTROS PCT: 55 %
Neutrophils Absolute: 3.9 10*3/uL (ref 1.4–7.0)
PLATELETS: 500 10*3/uL — AB (ref 150–379)
RBC: 4.49 x10E6/uL (ref 4.14–5.80)
RDW: 14.2 % (ref 12.3–15.4)
WBC: 7.1 10*3/uL (ref 3.4–10.8)

## 2015-09-28 LAB — PSA: Prostate Specific Ag, Serum: 2.9 ng/mL (ref 0.0–4.0)

## 2015-09-30 ENCOUNTER — Telehealth: Payer: Self-pay

## 2015-09-30 NOTE — Telephone Encounter (Signed)
-----   Message from Jodell Ciproennis E Edisto Beachhrismon, [place] sent at 09/27/2015  5:56 PM EDT ----- Culture isolated E.coli bacteria causing an increase in frequency and possible causing difficulty with urine flow. The antibiotic given should clear the infection. If urine flow not better in 1 week, will need recheck by urologist for possible surgical deflation of large hydrocele.

## 2015-09-30 NOTE — Telephone Encounter (Signed)
-----   Message from Tamsen Roersennis E Chrismon, [place] sent at 09/30/2015  8:21 AM EDT ----- All blood tests normal except kidney function showing strain. Probably from infection. Need to recheck urinalysis and kidney tests in a week to be sure antibiotics clears this infection. If urine flow no better at that time, need to consider recheck with urologist to see about fixing the large hydrocele he has.

## 2015-09-30 NOTE — Telephone Encounter (Signed)
Patient advised as below.  

## 2015-09-30 NOTE — Telephone Encounter (Signed)
Patient advised as below. Patient reports he is feeling much better. sd

## 2015-10-03 ENCOUNTER — Telehealth: Payer: Self-pay | Admitting: Family Medicine

## 2015-10-03 NOTE — Telephone Encounter (Signed)
Patient needs refill on Clonazepam to [facility] on [address]

## 2015-10-04 ENCOUNTER — Other Ambulatory Visit: Payer: Self-pay | Admitting: Family Medicine

## 2015-10-04 MED ORDER — CLONAZEPAM 0.5 MG PO TABS: 0.5000 mg | ORAL_TABLET | Freq: Every day | ORAL | 1 refills | Status: DC

## 2015-10-04 NOTE — Telephone Encounter (Signed)
[facility]

## 2015-12-03 ENCOUNTER — Other Ambulatory Visit: Payer: Self-pay | Admitting: Family Medicine

## 2015-12-03 ENCOUNTER — Telehealth: Payer: Self-pay | Admitting: Family Medicine

## 2015-12-03 DIAGNOSIS — R053 Chronic cough: Secondary | ICD-10-CM | POA: Insufficient documentation

## 2015-12-03 DIAGNOSIS — R05 Cough: Secondary | ICD-10-CM | POA: Insufficient documentation

## 2015-12-03 DIAGNOSIS — F4323 Adjustment disorder with mixed anxiety and depressed mood: Secondary | ICD-10-CM

## 2015-12-03 MED ORDER — CLONAZEPAM 0.5 MG PO TABS: 0.5000 mg | ORAL_TABLET | Freq: Every day | ORAL | 1 refills | Status: DC

## 2015-12-03 MED ORDER — SERTRALINE HCL 50 MG PO TABS: 50.0000 mg | ORAL_TABLET | Freq: Every day | ORAL | 6 refills | Status: DC

## 2015-12-03 NOTE — Telephone Encounter (Signed)
Phone in refills [facility] [address] Thanks!

## 2015-12-03 NOTE — Telephone Encounter (Signed)
Needs refills on Sertraline and Clonazepam called to [facility] on [address].

## 2015-12-04 NOTE — Telephone Encounter (Signed)
[facility]

## 2015-12-16 ENCOUNTER — Encounter: Payer: Self-pay | Admitting: Family Medicine

## 2015-12-16 ENCOUNTER — Ambulatory Visit (INDEPENDENT_AMBULATORY_CARE_PROVIDER_SITE_OTHER): Payer: Medicare PPO | Admitting: Family Medicine

## 2015-12-16 VITALS — BP 132/68 | HR 64 | Temp 97.7°F | Resp 16 | Ht 68.5 in | Wt 175.6 lb

## 2015-12-16 DIAGNOSIS — I10 Essential (primary) hypertension: Secondary | ICD-10-CM

## 2015-12-16 DIAGNOSIS — Q394 Esophageal web: Secondary | ICD-10-CM | POA: Diagnosis not present

## 2015-12-16 DIAGNOSIS — Z23 Encounter for immunization: Secondary | ICD-10-CM

## 2015-12-16 DIAGNOSIS — J449 Chronic obstructive pulmonary disease, unspecified: Secondary | ICD-10-CM | POA: Diagnosis not present

## 2015-12-16 DIAGNOSIS — Z0000 Encounter for general adult medical examination without abnormal findings: Secondary | ICD-10-CM | POA: Diagnosis not present

## 2015-12-16 NOTE — Progress Notes (Signed)
Patient: Andrew Hall, Male    DOB: [birth year], 75 y.o.   MRN: [id number] Visit Date: 12/16/2015  Today's Provider: Dortha Kern, PA   Chief Complaint  Patient presents with  . Medicare Wellness   Subjective:    Annual wellness visit Andrew Hall is a 75 y.o. male who presents today for his Subsequent Annual Wellness Visit. He feels fairly well. He reports exercising none. He reports he is sleeping fairly well.  -----------------------------------------------------------   Review of Systems  Constitutional: Negative.   HENT: Negative.   Eyes: Negative.   Respiratory: Positive for shortness of breath and wheezing.        Intermittent wheezing and shortness of breath.  Gastrointestinal: Negative.   Endocrine: Negative.   Genitourinary: Positive for scrotal swelling. Negative for dysuria.  Musculoskeletal: Negative.     Social History   Social History  . Marital status: Married    Spouse name: N/A  . Number of children: N/A  . Years of education: N/A   Occupational History  . Not on file.   Social History Main Topics  . Smoking status: Former Games developer  . Smokeless tobacco: Not on file  . Alcohol use No     Comment: stopped in 2012  . Drug use: No  . Sexual activity: Not on file   Other Topics Concern  . Not on file   Social History Narrative  . No narrative on file    Patient Active Problem List   Diagnosis Date Noted  . Chronic cough 12/03/2015  . Abnormal blood chemistry 06/27/2014  . Adjustment disorder with mixed anxiety and depressed mood 06/27/2014  . Anxiety disorder 06/27/2014  . Chest pain on exertion 06/27/2014  . COPD, mild (HCC) 06/27/2014  . Acid indigestion 06/27/2014  . Can't get food down 06/27/2014  . Breath shortness 06/27/2014  . Herpes zona 06/27/2014  . History of digestive disease 06/27/2014  . Hydrocele 06/27/2014  . HLD (hyperlipidemia) 06/27/2014  . Benign hypertension 06/27/2014  . BP (high blood pressure) 06/27/2014   . 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 Cerumen impaction 06/27/2014  . Pruritic dermatitis 06/27/2014  . Restless leg 06/27/2014  . 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 Unexplained weight loss 06/27/2014  . Inguinal hernia recurrent bilateral 08/28/2008  . 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 Malaise and fatigue 12/17/2006  . Accumulation of fluid in tissues 07/26/2006  . Essential (primary) hypertension 07/26/2006  . Tobacco use 07/26/2006    Past Surgical History:  Procedure Laterality Date  . HERNIA REPAIR Bilateral 08/08/2010    His family history includes Heart disease in his mother.    Previous Medications   ALBUTEROL (PROVENTIL HFA;VENTOLIN HFA) 108 (90 BASE) MCG/ACT INHALER    Inhale 2 puffs into the lungs 4 (four) times daily as needed.   AMLODIPINE (NORVASC) 5 MG TABLET    Take 1 tablet (5 mg total) by mouth daily.   B COMPLEX VITAMINS (VITAMIN B COMPLEX PO)    Take by mouth daily.   CLONAZEPAM (KLONOPIN) 0.5 MG TABLET    Take 1 tablet (0.5 mg total) by mouth at bedtime.   FENOFIBRATE 160 MG TABLET    Take 1 tablet (160 mg total) by mouth daily.   FLUTICASONE-SALMETEROL    1 Inhaler 2 (two) times daily.   OMEGA 3 1000 MG CAPS    Take 1 capsule by mouth daily.   OMEPRAZOLE (PRILOSEC) 40 MG CAPSULE    Take 1 capsule by mouth daily.   SERTRALINE (ZOLOFT) 50 MG  TABLET    Take 1 tablet (50 mg total) by mouth at bedtime.   UMECLIDINIUM BROMIDE (INCRUSE ELLIPTA) 62.5 MCG/INH AEPB    Inhale 1 puff into the lungs daily.    Patient Care Team: Andrew Roers, PA as PCP - General (Physician Assistant)     Objective:   Vitals: BP 132/68 (BP Location: Right Arm, Patient Position: Sitting, Cuff Size: Normal)   Pulse 64   Temp 97.7 F (36.5 C) (Oral)   Resp 16   Ht 5' 8.5" (1.74 m)   Wt 175 lb 9.6 oz (79.7 kg)   BMI 26.31 kg/m  Wt Readings from Last 3 Encounters:  12/16/15 175 lb 9.6 oz (79.7 kg)  09/24/15 169 lb (76.7 kg)  08/26/15 172 lb (78 kg)    Physical Exam   Constitutional: He is oriented to person, place, and time. He appears well-developed and well-nourished.  HENT:  Head: Normocephalic and atraumatic.  Right Ear: External ear normal.  Left Ear: External ear normal.  Nose: Nose normal.  Mouth/Throat: Oropharynx is clear and moist.  Eyes: Conjunctivae and EOM are normal. Pupils are equal, round, and reactive to light. Right eye exhibits no discharge.  Neck: Normal range of motion. Neck supple. No tracheal deviation present. No thyromegaly present.  Cardiovascular: Normal rate, regular rhythm, normal heart sounds and intact distal pulses.   No murmur heard. Pulmonary/Chest: Effort normal and breath sounds normal. No respiratory distress. He has no wheezes. He has no rales. He exhibits no tenderness.  Abdominal: Soft. Bowel sounds are normal. He exhibits no distension and no mass. There is no tenderness. There is no rebound and no guarding.  Genitourinary: Prostate normal and penis normal.  Genitourinary Comments: Very large and tense hydrocele - scrotum the size of a grapefruit.  Musculoskeletal: Normal range of motion. He exhibits no edema or tenderness.  Lymphadenopathy:    He has no cervical adenopathy.  Neurological: He is alert and oriented to person, place, and time. He has normal reflexes. No cranial nerve deficit. He exhibits normal muscle tone. Coordination normal.  Skin: Skin is warm and dry. No rash noted. No erythema.  Psychiatric: He has a normal mood and affect. His behavior is normal. Judgment and thought content normal.    Activities of Daily Living In your present state of health, do you have any difficulty performing the following activities: 12/16/2015  Hearing? Y  Vision? N  Difficulty concentrating or making decisions? N  Walking or climbing stairs? N  Dressing or bathing? N  Doing errands, shopping? N  Some recent data might be hidden    Fall Risk Assessment Fall Risk  12/16/2015 12/10/2014  Falls in the past  year? No No     Depression Screen PHQ 2/9 Scores 12/16/2015 12/10/2014  PHQ - 2 Score 2 1    Cognitive Testing - 6-CIT  Correct? Score   What year is it? yes 0 0 or 4  What month is it? yes 0 0 or 3  Memorize:    Andrew Hall,  41,  [address],  [place],      What time is it? (within 1 hour) yes 0 0 or 3  Count backwards from 20 yes 0 0, 2, or 4  Name the months of the year yes 0 0, 2, or 4  Repeat name & address above no 2 0, 2, 4, 6, 8, or 10       TOTAL SCORE  2/28   Interpretation:  Normal  Normal (0-7) Abnormal (8-28)    Audit-C Alcohol Use Screening  Question Answer Points  How often do you have alcoholic drink? never 0  On days you do drink alcohol, how many drinks do you typically consume? 0 0  How often will you drink 6 or more in a total? never 0  Total Score:  0   A score of 3 or more in women, and 4 or more in men indicates increased risk for alcohol abuse, EXCEPT if all of the points are from question 1.     Assessment & Plan:     Annual Wellness Visit  Reviewed patient's Family Medical History Reviewed and updated list of patient's medical providers Assessment of cognitive impairment was done Assessed patient's functional ability Established a written schedule for health screening services Health Risk Assessent Completed and Reviewed  Exercise Activities and Dietary recommendations Goals    Continues to do some yard work but most concerned about caring for wife.      Immunization History  Administered Date(s) Administered  . Influenza, High Dose Seasonal PF 12/10/2014  . Pneumococcal Conjugate-13 07/26/2014  . Tdap 09/05/2012  . Zoster 07/26/2014    Health Maintenance  Topic Date Due  . PNA vac Low Risk Adult (2 of 2 - PPSV23) 07/26/2015  . INFLUENZA VACCINE  09/24/2015  . TETANUS/TDAP  09/06/2022  . COLONOSCOPY  06/26/2024  . ZOSTAVAX  Completed      Discussed health benefits of physical activity, and encouraged him to engage in  regular exercise appropriate for his age and condition.    ------------------------------------------------------------------------------------------------------------  1. Medicare annual wellness visit, subsequent Fair health. Screening tests essentially normal. Immunizations up dated. Recheck prn.  2. Esophagogastric ring Still has occasions of food getting "hung" in esophagus. 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 Niel HummerIftikhar. Dilated with endoscope. Still taking the Omeprazole 40 mg qd with good control of dyspepsia. May need re-stretching of stricture.  3. COPD, mild (HCC) Some dyspnea and cough with exertion. Still feel the Proventil-HFA prn rescue from wheeze and Incruse Ellipta have helped. Recheck in 2 months  4. Benign hypertension Stable and controlled BP. Continues to take Norvasc 5 mg qd with mild puffiness of ankles. Patient having no discomfort.   5. Need for influenza vaccination - Flu vaccine HIGH DOSE PF  6. Need for pneumococcal vaccination - Pneumococcal polysaccharide vaccine 23-valent greater than or equal to 2yo subcutaneous/IM

## 2015-12-27 ENCOUNTER — Other Ambulatory Visit: Payer: Self-pay | Admitting: Family Medicine

## 2015-12-27 ENCOUNTER — Telehealth: Payer: Self-pay | Admitting: Family Medicine

## 2015-12-27 DIAGNOSIS — E782 Mixed hyperlipidemia: Secondary | ICD-10-CM

## 2015-12-27 DIAGNOSIS — I10 Essential (primary) hypertension: Secondary | ICD-10-CM

## 2015-12-27 MED ORDER — AMLODIPINE BESYLATE 5 MG PO TABS: 5.0000 mg | ORAL_TABLET | Freq: Every day | ORAL | 3 refills | Status: DC

## 2015-12-27 MED ORDER — FENOFIBRATE 160 MG PO TABS: 160.0000 mg | ORAL_TABLET | Freq: Every day | ORAL | 6 refills | Status: DC

## 2015-12-27 NOTE — Telephone Encounter (Signed)
Needs refills on Fenofibrate 160 mg. And [facility] to [facility] [address]

## 2016-02-03 ENCOUNTER — Encounter: Payer: Self-pay | Admitting: Family Medicine

## 2016-02-03 ENCOUNTER — Ambulatory Visit (INDEPENDENT_AMBULATORY_CARE_PROVIDER_SITE_OTHER): Payer: Medicare PPO | Admitting: Family Medicine

## 2016-02-03 ENCOUNTER — Other Ambulatory Visit: Payer: Self-pay | Admitting: Family Medicine

## 2016-02-03 VITALS — BP 148/84 | HR 77 | Temp 97.9°F | Resp 14 | Wt 179.4 lb

## 2016-02-03 DIAGNOSIS — F419 Anxiety disorder, unspecified: Secondary | ICD-10-CM

## 2016-02-03 DIAGNOSIS — J449 Chronic obstructive pulmonary disease, unspecified: Secondary | ICD-10-CM | POA: Diagnosis not present

## 2016-02-03 DIAGNOSIS — E782 Mixed hyperlipidemia: Secondary | ICD-10-CM

## 2016-02-03 DIAGNOSIS — I10 Essential (primary) hypertension: Secondary | ICD-10-CM | POA: Diagnosis not present

## 2016-02-03 MED ORDER — CLONAZEPAM 0.5 MG PO TABS: 0.5000 mg | ORAL_TABLET | Freq: Every day | ORAL | 1 refills | Status: DC

## 2016-02-03 MED ORDER — FLUTICASONE FUROATE-VILANTEROL 200-25 MCG/INH IN AEPB: 1.0000 | INHALATION_SPRAY | Freq: Every day | RESPIRATORY_TRACT | 0 refills | Status: DC

## 2016-02-03 NOTE — Progress Notes (Signed)
Patient: Andrew LinCurtis W Beynon Male    DOB: [birth year]   75 y.o.   MRN: [id number] Visit Date: 02/03/2016  Today's Provider: Dortha Kernennis Jody Aguinaga, PA   Chief Complaint  Patient presents with  . Hyperlipidemia  . Hypertension  . Anxiety  . Follow-up   Subjective:    HPI  Hypertension, follow-up:  BP Readings from Last 3 Encounters:  02/03/16 (!) 148/84  12/16/15 132/68  09/24/15 (!) 144/70    He was last seen for hypertension 2 months ago.  BP at that visit was 132/68. Management since that visit includes continue medication.He reports good compliance with treatment. He is not having side effects.  He is not exercising. He is adherent to low salt diet.   Outside blood pressures are not being checked. He is experiencing none.  Patient denies chest pain, irregular heart beat and palpitations.   Cardiovascular risk factors include advanced age (older than 5955 for men, 8465 for women), dyslipidemia and male gender.  Use of agents associated with hypertension: none.   ------------------------------------------------------------------------    Lipid/Cholesterol, Follow-up:   Last seen for this 2 months ago.  Management since that visit includes continue medication.  Last Lipid Panel:    Component Value Date/Time   CHOL 187 06/20/2015 1209   TRIG 172 (H) 06/20/2015 1209   HDL 36 (L) 06/20/2015 1209   CHOLHDL 5.2 (H) 06/20/2015 1209   LDLCALC 117 (H) 06/20/2015 1209    He reports good compliance with treatment. He is not having side effects.   Wt Readings from Last 3 Encounters:  02/03/16 179 lb 6.4 oz (81.4 kg)  12/16/15 175 lb 9.6 oz (79.7 kg)  09/24/15 169 lb (76.7 kg)    ------------------------------------------------------------------------   Anxiety: 2 month follow up. Symptoms are stable. Patient is requesting refill on Clonazepam.    Previous Medications   ALBUTEROL (PROVENTIL HFA;VENTOLIN HFA) 108 (90 BASE) MCG/ACT INHALER    Inhale 2 puffs into the lungs 4  (four) times daily as needed.   AMLODIPINE (NORVASC) 5 MG TABLET    Take 1 tablet (5 mg total) by mouth daily.   B COMPLEX VITAMINS (VITAMIN B COMPLEX PO)    Take by mouth daily.   CLONAZEPAM (KLONOPIN) 0.5 MG TABLET    Take 1 tablet (0.5 mg total) by mouth at bedtime.   FENOFIBRATE 160 MG TABLET    Take 1 tablet (160 mg total) by mouth daily.   FLUTICASONE-SALMETEROL    1 Inhaler 2 (two) times daily.   OMEGA 3 1000 MG CAPS    Take 1 capsule by mouth daily.   OMEPRAZOLE (PRILOSEC) 40 MG CAPSULE    Take 1 capsule by mouth daily.   SERTRALINE (ZOLOFT) 50 MG TABLET    Take 1 tablet (50 mg total) by mouth at bedtime.   UMECLIDINIUM BROMIDE (INCRUSE ELLIPTA) 62.5 MCG/INH AEPB    Inhale 1 puff into the lungs daily.    Review of Systems  Constitutional: Negative.   Respiratory: Negative.   Cardiovascular: Negative.     Social History  Substance Use Topics  . Smoking status: Former Games developermoker  . Smokeless tobacco: Not on file  . Alcohol use No     Comment: stopped in 2012   Objective:   BP (!) 148/84 (BP Location: Right Arm, Patient Position: Sitting, Cuff Size: Normal)   Pulse 77   Temp 97.9 F (36.6 C) (Oral)   Resp 14   Wt 179 lb 6.4 oz (81.4 kg)   BMI 26.88 kg/m  Physical Exam  Constitutional: He is oriented to person, place, and time. He appears well-developed and well-nourished.  HENT:  Head: Normocephalic.  Right Ear: External ear normal.  Left Ear: External ear normal.  Eyes: Conjunctivae are normal.  Neck: Neck supple.  Cardiovascular: Normal rate and regular rhythm.   Pulmonary/Chest:  Slightly coarse breath sounds with minimal wheeze.  Abdominal: Soft. Bowel sounds are normal.  Neurological: He is alert and oriented to person, place, and time.      Assessment & Plan:      1. Essential (primary) hypertension BP fair control. Gained 4 lbs since last office visit.Still taking Norvasc 5 mg qd without side effects. Recheck labs and follow up pending reports. - CBC  with Differential/Platelet - Comprehensive metabolic panel - Lipid panel - TSH  2. COPD, mild (HCC) Having wheezing at night with white sputum production in the mornings. History of mild COPD. Rarely using Albuterol. Will give Breo Ellipta 1 inhalation daily and encouraged to use the Albuterol prn up to 4 times a day. Check CBC for signs of infection. - CBC with Differential/Platelet - fluticasone furoate-vilanterol (BREO ELLIPTA) 200-25 MCG/INH AEPB; Inhale 1 puff into the lungs daily.  Dispense: 2 each; Refill: 0  3. Mixed hyperlipidemia Has gained 4 more lbs since last recheck. Must get back on low fat diet. Continue Fenofibrate and Mega-Red. Recheck lipids, CMP and TSH. Follow up pending reports. - Comprehensive metabolic panel - Lipid panel - TSH  4. Acute anxiety Well controlled with clonazepam at bedtime to help control restless legs and anxiety level. Will refill and recheck pending lab reports. - clonazePAM (KLONOPIN) 0.5 MG tablet; Take 1 tablet (0.5 mg total) by mouth at bedtime.  Dispense: 30 tablet; Refill: 1

## 2016-02-04 LAB — LIPID PANEL
CHOLESTEROL TOTAL: 173 mg/dL (ref 100–199)
Chol/HDL Ratio: 4.8 ratio units (ref 0.0–5.0)
HDL: 36 mg/dL — AB (ref >39)
LDL Calculated: 104 mg/dL — ABNORMAL HIGH (ref 0–99)
Triglycerides: 167 mg/dL — ABNORMAL HIGH (ref 0–149)
VLDL CHOLESTEROL CAL: 33 mg/dL (ref 5–40)

## 2016-02-04 LAB — CBC WITH DIFFERENTIAL/PLATELET
BASOS ABS: 0.1 10*3/uL (ref 0.0–0.2)
Basos: 1 %
EOS (ABSOLUTE): 0.4 10*3/uL (ref 0.0–0.4)
Eos: 5 %
Hematocrit: 48 % (ref 37.5–51.0)
Hemoglobin: 15.8 g/dL (ref 13.0–17.7)
Immature Grans (Abs): 0 10*3/uL (ref 0.0–0.1)
Immature Granulocytes: 0 %
LYMPHS ABS: 2.1 10*3/uL (ref 0.7–3.1)
Lymphs: 25 %
MCH: 30.5 pg (ref 26.6–33.0)
MCHC: 32.9 g/dL (ref 31.5–35.7)
MCV: 93 fL (ref 79–97)
Monocytes Absolute: 0.6 10*3/uL (ref 0.1–0.9)
Monocytes: 8 %
NEUTROS ABS: 5.1 10*3/uL (ref 1.4–7.0)
Neutrophils: 61 %
PLATELETS: 406 10*3/uL — AB (ref 150–379)
RBC: 5.18 x10E6/uL (ref 4.14–5.80)
RDW: 14.3 % (ref 12.3–15.4)
WBC: 8.3 10*3/uL (ref 3.4–10.8)

## 2016-02-04 LAB — COMPREHENSIVE METABOLIC PANEL
ALK PHOS: 63 IU/L (ref 39–117)
ALT: 11 IU/L (ref 0–44)
AST: 21 IU/L (ref 0–40)
Albumin/Globulin Ratio: 1.4 (ref 1.2–2.2)
Albumin: 4.3 g/dL (ref 3.5–4.8)
BILIRUBIN TOTAL: 0.4 mg/dL (ref 0.0–1.2)
BUN / CREAT RATIO: 14 (ref 10–24)
BUN: 16 mg/dL (ref 8–27)
CHLORIDE: 99 mmol/L (ref 96–106)
CO2: 30 mmol/L — ABNORMAL HIGH (ref 18–29)
Calcium: 9.7 mg/dL (ref 8.6–10.2)
Creatinine, Ser: 1.15 mg/dL (ref 0.76–1.27)
GFR calc Af Amer: 71 mL/min/{1.73_m2} (ref >59)
GFR calc non Af Amer: 61 mL/min/{1.73_m2} (ref >59)
GLUCOSE: 105 mg/dL — AB (ref 65–99)
Globulin, Total: 3 g/dL (ref 1.5–4.5)
Potassium: 5.5 mmol/L — ABNORMAL HIGH (ref 3.5–5.2)
Sodium: 142 mmol/L (ref 134–144)
Total Protein: 7.3 g/dL (ref 6.0–8.5)

## 2016-02-04 LAB — TSH: TSH: 2.33 u[IU]/mL (ref 0.450–4.500)

## 2016-02-04 NOTE — Telephone Encounter (Signed)
[facility] on 02/03/2016

## 2016-02-05 ENCOUNTER — Telehealth: Payer: Self-pay

## 2016-02-05 NOTE — Telephone Encounter (Signed)
-----   Message from Tamsen Roersennis E Chrismon, [place] sent at 02/04/2016  5:05 PM EST ----- Kidney function better than 4 months ago. Potassium still slightly elevated. Cholesterol better but triglycerides still up some. Increase water intake and continue present medications.

## 2016-02-05 NOTE — Telephone Encounter (Signed)
Patient has been advised. KW 

## 2016-02-10 ENCOUNTER — Ambulatory Visit: Payer: Medicare PPO | Admitting: Family Medicine

## 2016-04-01 ENCOUNTER — Telehealth: Payer: Self-pay | Admitting: Family Medicine

## 2016-04-01 NOTE — Telephone Encounter (Signed)
Patient needs Clonazepam refilled

## 2016-04-02 ENCOUNTER — Other Ambulatory Visit: Payer: Self-pay | Admitting: Family Medicine

## 2016-04-02 DIAGNOSIS — F419 Anxiety disorder, unspecified: Secondary | ICD-10-CM

## 2016-04-02 DIAGNOSIS — G2581 Restless legs syndrome: Secondary | ICD-10-CM

## 2016-04-02 MED ORDER — CLONAZEPAM 0.5 MG PO TABS: 0.5000 mg | ORAL_TABLET | Freq: Every day | ORAL | 1 refills | Status: DC

## 2016-04-02 NOTE — Telephone Encounter (Signed)
Called RX in again at [facility].

## 2016-04-02 NOTE — Telephone Encounter (Signed)
Pt's daughter Andrew Hall called back advising that [facility] had advised them they had not received the Rx for clonazePAM (KLONOPIN) 0.5 MG tablet and there wasn't a voicemail on their end. Please advise. Thanks TNP

## 2016-04-02 NOTE — Telephone Encounter (Signed)
Phone in refill of clonazepam as ordered in chart.

## 2016-04-02 NOTE — Telephone Encounter (Signed)
RX called in at [facility] pharmacy. Patient advised.  

## 2016-05-01 ENCOUNTER — Other Ambulatory Visit: Payer: Self-pay | Admitting: Family Medicine

## 2016-05-01 ENCOUNTER — Telehealth: Payer: Self-pay | Admitting: Family Medicine

## 2016-05-01 DIAGNOSIS — I10 Essential (primary) hypertension: Secondary | ICD-10-CM

## 2016-05-01 MED ORDER — AMLODIPINE BESYLATE 5 MG PO TABS: 5.0000 mg | ORAL_TABLET | Freq: Every day | ORAL | 12 refills | Status: DC

## 2016-05-01 NOTE — Telephone Encounter (Signed)
Pt. Needs refills on Amlodipine called into [facility].

## 2016-05-01 NOTE — Telephone Encounter (Signed)
Done

## 2016-05-04 ENCOUNTER — Ambulatory Visit: Payer: Medicare PPO | Admitting: Family Medicine

## 2016-05-11 ENCOUNTER — Encounter: Payer: Self-pay | Admitting: Family Medicine

## 2016-05-11 ENCOUNTER — Ambulatory Visit (INDEPENDENT_AMBULATORY_CARE_PROVIDER_SITE_OTHER): Payer: Medicare PPO | Admitting: Family Medicine

## 2016-05-11 VITALS — BP 146/78 | HR 89 | Temp 97.8°F | Resp 16 | Wt 186.2 lb

## 2016-05-11 DIAGNOSIS — G2581 Restless legs syndrome: Secondary | ICD-10-CM

## 2016-05-11 DIAGNOSIS — I10 Essential (primary) hypertension: Secondary | ICD-10-CM | POA: Diagnosis not present

## 2016-05-11 DIAGNOSIS — F419 Anxiety disorder, unspecified: Secondary | ICD-10-CM

## 2016-05-11 DIAGNOSIS — R252 Cramp and spasm: Secondary | ICD-10-CM | POA: Diagnosis not present

## 2016-05-11 DIAGNOSIS — E782 Mixed hyperlipidemia: Secondary | ICD-10-CM | POA: Diagnosis not present

## 2016-05-11 NOTE — Patient Instructions (Signed)
Leg Cramps Leg cramps occur when a muscle or muscles tighten and you have no control over this tightening (involuntary muscle contraction). Muscle cramps can develop in any muscle, but the most common place is in the calf muscles of the leg. Those cramps can occur during exercise or when you are at rest. Leg cramps are painful, and they may last for a few seconds to a few minutes. Cramps may return several times before they finally stop. Usually, leg cramps are not caused by a serious medical problem. In many cases, the cause is not known. Some common causes include:  Overexertion.  Overuse from repetitive motions, or doing the same thing over and over.  Remaining in a certain position for a long period of time.  Improper preparation, form, or technique while performing a sport or an activity.  Dehydration.  Injury.  Side effects of some medicines.  Abnormally low levels of the salts and ions in your blood (electrolytes), especially potassium and calcium. These levels could be low if you are taking water pills (diuretics) or if you are pregnant. Follow these instructions at home: Watch your condition for any changes. Taking the following actions may help to lessen any discomfort that you are feeling:  Stay well-hydrated. Drink enough fluid to keep your urine clear or pale yellow.  Try massaging, stretching, and relaxing the affected muscle. Do this for several minutes at a time.  For tight or tense muscles, use a warm towel, heating pad, or hot shower water directed to the affected area.  If you are sore or have pain after a cramp, applying ice to the affected area may relieve discomfort.  Put ice in a plastic bag.  Place a towel between your skin and the bag.  Leave the ice on for 20 minutes, 2-3 times per day.  Avoid strenuous exercise for several days if you have been having frequent leg cramps.  Make sure that your diet includes the essential minerals for your muscles to  work normally.  Take medicines only as directed by your health care provider. Contact a health care provider if:  Your leg cramps get more severe or more frequent, or they do not improve over time.  Your foot becomes cold, numb, or blue. This information is not intended to replace advice given to you by your health care provider. Make sure you discuss any questions you have with your health care provider. Document Released: 03/19/2004 Document Revised: 07/18/2015 Document Reviewed: 01/17/2014 Elsevier Interactive Patient Education  2017 Elsevier Inc.  

## 2016-05-11 NOTE — Progress Notes (Signed)
Patient: Andrew Hall Male    DOB: [birth year]   76 y.o.   MRN: [id number] Visit Date: 05/11/2016  Today's Provider: Dortha Kernennis Chrismon, PA   Chief Complaint  Patient presents with  . Hypertension  . Hyperlipidemia  . Follow-up   Subjective:    HPI  Hypertension, follow-up:  BP Readings from Last 3 Encounters:  05/11/16 (!) 146/78  02/03/16 (!) 148/84  12/16/15 132/68    He was last seen for hypertension 3 months ago.  BP at that visit was 148/84. Management since that visit includes continue medications.He reports good compliance with treatment. He is not having side effects.  He is not exercising. He is adherent to low salt diet.   Outside blood pressures are  being checked sometimes. He is experiencing none.  Patient denies chest pain, chest pressure/discomfort, irregular heart beat and palpitations.   Cardiovascular risk factors include advanced age (older than 6155 for men, 7165 for women), dyslipidemia and male gender.  Use of agents associated with hypertension: none.   ------------------------------------------------------------------------    Lipid/Cholesterol, Follow-up:   Last seen for this 3 months ago.  Management since that visit includes continue medications. Encouraged a low fat diet.  Last Lipid Panel:    Component Value Date/Time   CHOL 173 02/03/2016 1559   TRIG 167 (H) 02/03/2016 1559   HDL 36 (L) 02/03/2016 1559   CHOLHDL 4.8 02/03/2016 1559   LDLCALC 104 (H) 02/03/2016 1559    He reports fair compliance with treatment. He is not having side effects.   Wt Readings from Last 3 Encounters:  05/11/16 186 lb 3.2 oz (84.5 kg)  02/03/16 179 lb 6.4 oz (81.4 kg)  12/16/15 175 lb 9.6 oz (79.7 kg)    ------------------------------------------------------------------------ Anxiety: 3 month follow up. Symptoms are stable. Patient does not need refills on Clonazepam at this time. Sleep better with changes in treatment of COPD.  Patient Active  Problem List   Diagnosis Date Noted  . Chronic cough 12/03/2015  . Abnormal blood chemistry 06/27/2014  . Adjustment disorder with mixed anxiety and depressed mood 06/27/2014  . Anxiety disorder 06/27/2014  . Chest pain on exertion 06/27/2014  . COPD, mild (HCC) 06/27/2014  . Acid indigestion 06/27/2014  . Can't get food down 06/27/2014  . Breath shortness 06/27/2014  . Herpes zona 06/27/2014  . History of digestive disease 06/27/2014  . Hydrocele 06/27/2014  . HLD (hyperlipidemia) 06/27/2014  . Benign hypertension 06/27/2014  . BP (high blood pressure) 06/27/2014  . 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 Cerumen impaction 06/27/2014  . Pruritic dermatitis 06/27/2014  . Restless leg 06/27/2014  . 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 Unexplained weight loss 06/27/2014  . Inguinal hernia recurrent bilateral 08/28/2008  . 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 Malaise and fatigue 12/17/2006  . Accumulation of fluid in tissues 07/26/2006  . Essential (primary) hypertension 07/26/2006  . Tobacco use 07/26/2006   Past Surgical History:  Procedure Laterality Date  . HERNIA REPAIR Bilateral 08/08/2010   Family History  Problem Relation Age of Onset  . Heart disease Mother    No Known Allergies   Previous Medications   ALBUTEROL (PROVENTIL HFA;VENTOLIN HFA) 108 (90 BASE) MCG/ACT INHALER    Inhale 2 puffs into the lungs 4 (four) times daily as needed.   AMLODIPINE (NORVASC) 5 MG TABLET    Take 1 tablet (5 mg total) by mouth daily.   B COMPLEX VITAMINS (VITAMIN B COMPLEX PO)    Take by mouth daily.   CLONAZEPAM (  KLONOPIN) 0.5 MG TABLET    TAKE ONE TABLET BY MOUTH AT BEDTIME   FENOFIBRATE 160 MG TABLET    Take 1 tablet (160 mg total) by mouth daily.   FLUTICASONE FUROATE-VILANTEROL (BREO ELLIPTA) 200-25 MCG/INH AEPB    Inhale 1 puff into the lungs daily.   FLUTICASONE-SALMETEROL    1 Inhaler 2 (two) times daily.   OMEGA 3 1000 MG CAPS    Take 1 capsule by mouth daily.   OMEPRAZOLE (PRILOSEC)  40 MG CAPSULE    Take 1 capsule by mouth daily.   SERTRALINE (ZOLOFT) 50 MG TABLET    Take 1 tablet (50 mg total) by mouth at bedtime.   UMECLIDINIUM BROMIDE (INCRUSE ELLIPTA) 62.5 MCG/INH AEPB    Inhale 1 puff into the lungs daily.    Review of Systems  Constitutional: Negative.   Respiratory: Negative.   Cardiovascular: Negative.   Musculoskeletal: Negative.        Leg cramps   Psychiatric/Behavioral: Negative.     Social History  Substance Use Topics  . Smoking status: Former Games developer  . Smokeless tobacco: Never Used  . Alcohol use No     Comment: stopped in 2012   Objective:   BP (!) 146/78 (BP Location: Right Arm, Patient Position: Sitting, Cuff Size: Normal)   Pulse 89   Temp 97.8 F (36.6 C) (Oral)   Resp 16   Wt 186 lb 3.2 oz (84.5 kg)   SpO2 96%   BMI 27.90 kg/m   Physical Exam  Constitutional: He appears well-developed and well-nourished.  HENT:  Head: Normocephalic.  Diminished hearing.  Eyes: Conjunctivae are normal.  Neck: Normal range of motion. Neck supple.  Cardiovascular: Normal rate.   Pulmonary/Chest: Effort normal and breath sounds normal.  Distant breath sounds. No rales or rhonchi.  Abdominal: Soft. Bowel sounds are normal.  Musculoskeletal:  1+ pitting of the left lower leg above ankle. History of severe injury when he dropped a 55 gallon barrel on the left calf. Tore muscles and ligaments but no surgery. Pulses are symmetric.      Assessment & Plan:     1. Essential (primary) hypertension BP still borderline. BP at home last Friday (05-08-16) was 128/62 by a d visiting nurse that comes by to see his wife. Still tolerating Amlodipine 5 mg qd without side effects. Recheck routine labs. - CBC with Differential/Platelet - Comprehensive metabolic panel - TSH  2. Mixed hyperlipidemia Tolerating Fenofibrate 160 mg qd. Trying to follow a low fat diet but descriptions of meals are not good. Recheck labs and counseled about dietary choices -  Comprehensive metabolic panel - Lipid panel - TSH  3. Anxiety disorder, unspecified type Some nervousness continues but feel better with use of Clonazepam 0.5 mg at bedtime. Use of the Incruse and Breo controls dyspnea from COPD. This helps control anxiousness from dyspnea.. Will check CBC and TSH. No daytime drowsiness. - CBC with Differential/Platelet - TSH  4. Nocturnal muscle cramps Onset over the past couple weeks. No problems during the day. Walk in the yard and "feed the chickens" without cramps. Some slight puffiness in the left lower leg. Improves over night with elevation. Check labs and follow up pending reports. - CBC with Differential/Platelet - Comprehensive metabolic panel - TSH  5. Restless leg Still having occasional restlessness of legs. The Clonazepam 0.5 mg at bedtime continues to keep leg movement under control at. - CBC with Differential/Platelet

## 2016-05-12 ENCOUNTER — Telehealth: Payer: Self-pay

## 2016-05-12 LAB — COMPREHENSIVE METABOLIC PANEL
ALT: 13 IU/L (ref 0–44)
AST: 23 IU/L (ref 0–40)
Albumin/Globulin Ratio: 1.6 (ref 1.2–2.2)
Albumin: 4.6 g/dL (ref 3.5–4.8)
Alkaline Phosphatase: 69 IU/L (ref 39–117)
BUN / CREAT RATIO: 16 (ref 10–24)
BUN: 17 mg/dL (ref 8–27)
Bilirubin Total: 0.5 mg/dL (ref 0.0–1.2)
CALCIUM: 9.3 mg/dL (ref 8.6–10.2)
CO2: 26 mmol/L (ref 18–29)
CREATININE: 1.06 mg/dL (ref 0.76–1.27)
Chloride: 98 mmol/L (ref 96–106)
GFR calc Af Amer: 78 mL/min/{1.73_m2} (ref >59)
GFR calc non Af Amer: 68 mL/min/{1.73_m2} (ref >59)
Globulin, Total: 2.9 g/dL (ref 1.5–4.5)
Glucose: 98 mg/dL (ref 65–99)
POTASSIUM: 5.5 mmol/L — AB (ref 3.5–5.2)
SODIUM: 141 mmol/L (ref 134–144)
Total Protein: 7.5 g/dL (ref 6.0–8.5)

## 2016-05-12 LAB — CBC WITH DIFFERENTIAL/PLATELET
Basophils Absolute: 0.1 10*3/uL (ref 0.0–0.2)
Basos: 0 %
EOS (ABSOLUTE): 0.2 10*3/uL (ref 0.0–0.4)
EOS: 1 %
HEMATOCRIT: 48.5 % (ref 37.5–51.0)
Hemoglobin: 16 g/dL (ref 13.0–17.7)
IMMATURE GRANULOCYTES: 0 %
Immature Grans (Abs): 0 10*3/uL (ref 0.0–0.1)
LYMPHS: 11 %
Lymphocytes Absolute: 1.9 10*3/uL (ref 0.7–3.1)
MCH: 31.1 pg (ref 26.6–33.0)
MCHC: 33 g/dL (ref 31.5–35.7)
MCV: 94 fL (ref 79–97)
MONOS ABS: 1.6 10*3/uL — AB (ref 0.1–0.9)
Monocytes: 9 %
NEUTROS PCT: 79 %
Neutrophils Absolute: 13.2 10*3/uL — ABNORMAL HIGH (ref 1.4–7.0)
PLATELETS: 340 10*3/uL (ref 150–379)
RBC: 5.15 x10E6/uL (ref 4.14–5.80)
RDW: 14.8 % (ref 12.3–15.4)
WBC: 16.9 10*3/uL — AB (ref 3.4–10.8)

## 2016-05-12 LAB — TSH: TSH: 2.76 u[IU]/mL (ref 0.450–4.500)

## 2016-05-12 LAB — LIPID PANEL
CHOLESTEROL TOTAL: 178 mg/dL (ref 100–199)
Chol/HDL Ratio: 4.5 ratio units (ref 0.0–5.0)
HDL: 40 mg/dL (ref >39)
LDL Calculated: 110 mg/dL — ABNORMAL HIGH (ref 0–99)
TRIGLYCERIDES: 141 mg/dL (ref 0–149)
VLDL Cholesterol Cal: 28 mg/dL (ref 5–40)

## 2016-05-12 NOTE — Telephone Encounter (Signed)
LMTCB  Thanks,  -Joseline 

## 2016-05-12 NOTE — Telephone Encounter (Signed)
Daughter advises. She is on DPR. Allene DillonEmily Drozdowski, CMA

## 2016-05-12 NOTE — Telephone Encounter (Signed)
-----   Message from Tamsen Roersennis E Chrismon, [place] sent at 05/12/2016  8:24 AM EDT ----- Cholesterol and triglycerides better. Potassium high and WBC counts high which usually indicates some infection. Need to check urine specimen for any signs and let us know if any congestion symptoms or fever.

## 2016-05-18 ENCOUNTER — Other Ambulatory Visit (INDEPENDENT_AMBULATORY_CARE_PROVIDER_SITE_OTHER): Payer: Medicare PPO

## 2016-05-18 DIAGNOSIS — D72828 Other elevated white blood cell count: Secondary | ICD-10-CM

## 2016-05-18 LAB — POCT URINALYSIS DIPSTICK
BILIRUBIN UA: NEGATIVE
GLUCOSE UA: NEGATIVE
Ketones, UA: NEGATIVE
LEUKOCYTES UA: NEGATIVE
NITRITE UA: NEGATIVE
Spec Grav, UA: 1.01 (ref 1.030–1.035)
UROBILINOGEN UA: 0.2 (ref ?–2.0)
pH, UA: 6 (ref 5.0–8.0)

## 2016-05-18 MED ORDER — DOXYCYCLINE HYCLATE 100 MG PO TABS: 100.0000 mg | ORAL_TABLET | Freq: Two times a day (BID) | ORAL | 0 refills | Status: DC

## 2016-06-01 ENCOUNTER — Other Ambulatory Visit: Payer: Self-pay | Admitting: Family Medicine

## 2016-06-01 DIAGNOSIS — F419 Anxiety disorder, unspecified: Secondary | ICD-10-CM

## 2016-06-01 MED ORDER — CLONAZEPAM 0.5 MG PO TABS: 0.5000 mg | ORAL_TABLET | Freq: Every day | ORAL | 1 refills | Status: DC

## 2016-06-01 NOTE — Telephone Encounter (Signed)
Pt contacted office for refill request on the following medications:  clonazePAM (KLONOPIN) 0.5 MG tablet.  [facility] [address]  CB#[phone]/MW

## 2016-06-01 NOTE — Telephone Encounter (Signed)
Placed printed prescription on your desk for phone in or patient pick up.

## 2016-06-02 NOTE — Telephone Encounter (Signed)
RX called in at [facility]. Left patient a voicemail advising him.

## 2016-06-08 DIAGNOSIS — D72828 Other elevated white blood cell count: Secondary | ICD-10-CM | POA: Diagnosis not present

## 2016-06-09 LAB — CBC WITH DIFFERENTIAL/PLATELET
BASOS ABS: 0.1 10*3/uL (ref 0.0–0.2)
Basos: 1 %
EOS (ABSOLUTE): 0.3 10*3/uL (ref 0.0–0.4)
Eos: 4 %
HEMOGLOBIN: 15.2 g/dL (ref 13.0–17.7)
Hematocrit: 46.6 % (ref 37.5–51.0)
Immature Grans (Abs): 0 10*3/uL (ref 0.0–0.1)
Immature Granulocytes: 0 %
LYMPHS ABS: 2.2 10*3/uL (ref 0.7–3.1)
Lymphs: 25 %
MCH: 30.6 pg (ref 26.6–33.0)
MCHC: 32.6 g/dL (ref 31.5–35.7)
MCV: 94 fL (ref 79–97)
MONOCYTES: 9 %
MONOS ABS: 0.8 10*3/uL (ref 0.1–0.9)
NEUTROS ABS: 5.3 10*3/uL (ref 1.4–7.0)
Neutrophils: 61 %
Platelets: 339 10*3/uL (ref 150–379)
RBC: 4.96 x10E6/uL (ref 4.14–5.80)
RDW: 15 % (ref 12.3–15.4)
WBC: 8.7 10*3/uL (ref 3.4–10.8)

## 2016-06-29 ENCOUNTER — Telehealth: Payer: Self-pay | Admitting: Family Medicine

## 2016-06-29 ENCOUNTER — Other Ambulatory Visit: Payer: Self-pay | Admitting: Family Medicine

## 2016-06-29 MED ORDER — SERTRALINE HCL 50 MG PO TABS: 50.0000 mg | ORAL_TABLET | Freq: Every day | ORAL | 6 refills | Status: DC

## 2016-06-29 NOTE — Telephone Encounter (Signed)
Sent refills to the [facility] [address]

## 2016-06-29 NOTE — Telephone Encounter (Signed)
Patient needs refill on Sertraline called to [facility] on [address]

## 2016-07-01 ENCOUNTER — Other Ambulatory Visit: Payer: Self-pay | Admitting: Family Medicine

## 2016-07-27 ENCOUNTER — Other Ambulatory Visit: Payer: Self-pay | Admitting: Family Medicine

## 2016-07-27 DIAGNOSIS — E782 Mixed hyperlipidemia: Secondary | ICD-10-CM

## 2016-07-27 DIAGNOSIS — F419 Anxiety disorder, unspecified: Secondary | ICD-10-CM

## 2016-07-27 MED ORDER — CLONAZEPAM 0.5 MG PO TABS: 0.5000 mg | ORAL_TABLET | Freq: Every day | ORAL | 1 refills | Status: DC

## 2016-07-27 MED ORDER — FENOFIBRATE 160 MG PO TABS
160.0000 mg | ORAL_TABLET | Freq: Every day | ORAL | 6 refills | Status: DC
Start: 2016-07-27 — End: 2017-03-01

## 2016-07-27 NOTE — Telephone Encounter (Signed)
Patient needs refills on Fenofibrate and Klonopen to [facility] on [address]

## 2016-07-27 NOTE — Telephone Encounter (Signed)
Phone in refill of Klonopin as listed in medication list. Sent refill for Fenofibrate electronically.

## 2016-07-27 NOTE — Telephone Encounter (Signed)
Pt informed and voiced understanding

## 2016-08-17 ENCOUNTER — Ambulatory Visit (INDEPENDENT_AMBULATORY_CARE_PROVIDER_SITE_OTHER): Payer: Medicare PPO | Admitting: Family Medicine

## 2016-08-17 ENCOUNTER — Encounter: Payer: Self-pay | Admitting: Family Medicine

## 2016-08-17 VITALS — BP 142/74 | HR 90 | Temp 97.8°F | Wt 190.2 lb

## 2016-08-17 DIAGNOSIS — I10 Essential (primary) hypertension: Secondary | ICD-10-CM | POA: Diagnosis not present

## 2016-08-17 DIAGNOSIS — R053 Chronic cough: Secondary | ICD-10-CM

## 2016-08-17 DIAGNOSIS — R05 Cough: Secondary | ICD-10-CM

## 2016-08-17 DIAGNOSIS — E782 Mixed hyperlipidemia: Secondary | ICD-10-CM | POA: Diagnosis not present

## 2016-08-17 DIAGNOSIS — F419 Anxiety disorder, unspecified: Secondary | ICD-10-CM | POA: Diagnosis not present

## 2016-08-17 DIAGNOSIS — J449 Chronic obstructive pulmonary disease, unspecified: Secondary | ICD-10-CM | POA: Diagnosis not present

## 2016-08-17 MED ORDER — ALBUTEROL SULFATE HFA 108 (90 BASE) MCG/ACT IN AERS: 2.0000 | INHALATION_SPRAY | Freq: Four times a day (QID) | RESPIRATORY_TRACT | 2 refills | Status: DC | PRN

## 2016-08-17 MED ORDER — DOXYCYCLINE HYCLATE 100 MG PO TABS: 100.0000 mg | ORAL_TABLET | Freq: Two times a day (BID) | ORAL | 0 refills | Status: DC

## 2016-08-17 NOTE — Progress Notes (Signed)
Patient: Andrew LinCurtis W Noblet Male    DOB: 1941/02/04   76 y.o.   MRN: [id number] Visit Date: 08/17/2016  Today's Provider: Dortha Kernennis Chrismon, PA   Chief Complaint  Patient presents with  . Hypertension  . Hyperlipidemia  . Anxiety  . Follow-up   Subjective:    HPI  Hypertension, follow-up:  BP Readings from Last 3 Encounters:  08/17/16 (!) 142/74  05/11/16 (!) 146/78  02/03/16 (!) 148/84    He was last seen for hypertension 3 months ago.  BP at that visit was 146/78. Management since that visit includes continue medications and follow a low fat diet.He reports fair compliance with treatment. He is not having side effects.  He is not exercising. He is adherent to low salt diet.   Outside blood pressures are being checked sometimes. He is experiencing none.  Patient denies chest pain, chest pressure/discomfort, irregular heart beat and palpitations.   Cardiovascular risk factors include advanced age (older than 3355 for men, 3565 for women), dyslipidemia, hypertension and male gender.  Use of agents associated with hypertension: none.   ------------------------------------------------------------------------    Lipid/Cholesterol, Follow-up:   Last seen for this 3 months ago.  Management since that visit includes continue medications and follow a low fat diet.  Last Lipid Panel:    Component Value Date/Time   CHOL 178 05/11/2016 1604   TRIG 141 05/11/2016 1604   HDL 40 05/11/2016 1604   CHOLHDL 4.5 05/11/2016 1604   LDLCALC 110 (H) 05/11/2016 1604    He reports fair compliance with treatment. He is not having side effects.   Wt Readings from Last 3 Encounters:  08/17/16 190 lb 3.2 oz (86.3 kg)  05/11/16 186 lb 3.2 oz (84.5 kg)  02/03/16 179 lb 6.4 oz (81.4 kg)    ------------------------------------------------------------------------ Anxiety: 3 month follow up. Symptoms are stable. Patient does not need refills on Clonazepam at this time. Last refill was  07/27/2016. Patient reports sleeping poorly.    Previous Medications   ALBUTEROL (PROVENTIL HFA;VENTOLIN HFA) 108 (90 BASE) MCG/ACT INHALER    Inhale 2 puffs into the lungs 4 (four) times daily as needed.   AMLODIPINE (NORVASC) 5 MG TABLET    Take 1 tablet (5 mg total) by mouth daily.   B COMPLEX VITAMINS (VITAMIN B COMPLEX PO)    Take by mouth daily.   CLONAZEPAM (KLONOPIN) 0.5 MG TABLET    Take 1 tablet (0.5 mg total) by mouth at bedtime.   DOXYCYCLINE (VIBRA-TABS) 100 MG TABLET    Take 1 tablet (100 mg total) by mouth 2 (two) times daily.   FENOFIBRATE 160 MG TABLET    Take 1 tablet (160 mg total) by mouth daily.   FLUTICASONE FUROATE-VILANTEROL (BREO ELLIPTA) 200-25 MCG/INH AEPB    Inhale 1 puff into the lungs daily.   FLUTICASONE-SALMETEROL    1 Inhaler 2 (two) times daily.   OMEGA 3 1000 MG CAPS    Take 1 capsule by mouth daily.   OMEPRAZOLE (PRILOSEC) 40 MG CAPSULE    Take 1 capsule by mouth daily.   SERTRALINE (ZOLOFT) 50 MG TABLET    TAKE ONE TABLET BY MOUTH AT BEDTIME   UMECLIDINIUM BROMIDE (INCRUSE ELLIPTA) 62.5 MCG/INH AEPB    Inhale 1 puff into the lungs daily.    Review of Systems  Constitutional: Negative.   Respiratory: Negative.   Cardiovascular: Negative.   Psychiatric/Behavioral: Positive for sleep disturbance.   Social History  Substance Use Topics  . Smoking status: Former Games developermoker  .  Smokeless tobacco: Never Used  . Alcohol use No     Comment: stopped in 2012   No Known Allergies  Objective:   BP (!) 142/74 (BP Location: Right Arm, Patient Position: Sitting, Cuff Size: Normal)   Pulse 90   Temp 97.8 F (36.6 C) (Oral)   Wt 190 lb 3.2 oz (86.3 kg)   SpO2 93%   BMI 28.50 kg/m   Physical Exam  Constitutional: He is oriented to person, place, and time. He appears well-developed and well-nourished. No distress.  HENT:  Head: Normocephalic and atraumatic.  Right Ear: Hearing normal.  Left Ear: Hearing normal.  Nose: Nose normal.  Eyes: Conjunctivae and  lids are normal. Right eye exhibits no discharge. Left eye exhibits no discharge. No scleral icterus.  Neck: Neck supple.  Cardiovascular: Normal rate and regular rhythm.   Pulmonary/Chest: Effort normal. No respiratory distress. He has wheezes.  Rhonchi and slight dyspnea.  Abdominal: Soft. Bowel sounds are normal.  Musculoskeletal: Normal range of motion.  Neurological: He is alert and oriented to person, place, and time.  Skin: Skin is intact. No lesion and no rash noted.  Psychiatric: He has a normal mood and affect. His speech is normal and behavior is normal. Thought content normal.      Assessment & Plan:     1. Chronic cough Onset the past week without fever. Some early morning sputum production and wheezing with cough. Symptoms worse in hot humid weather. Will refill Ventolin-HFA to use prn, given more samples of Breo 100/25 mcg 1 inhalation daily and add Doxycycline for exacerbation of COPD. - albuterol (PROVENTIL HFA;VENTOLIN HFA) 108 (90 Base) MCG/ACT inhaler; Inhale 2 puffs into the lungs 4 (four) times daily as needed.  Dispense: 18 g; Refill: 2 - CBC with Differential/Platelet - doxycycline (VIBRA-TABS) 100 MG tablet; Take 1 tablet (100 mg total) by mouth 2 (two) times daily.  Dispense: 20 tablet; Refill: 0  2. COPD, mild (HCC) Some dyspnea and wheezing with pulse oximetry 93%. Spirometry showed moderately severe restrictive disease. Suspect acute exacerbation of COPD. No fever but some occasional sputum production.  Spirometry difficult to accomplish. Restart Breo, refill Ventolin-HFA and add Doxycycline. Recheck in 5-7 days if no better. - Spirometry with graph - doxycycline (VIBRA-TABS) 100 MG tablet; Take 1 tablet (100 mg total) by mouth 2 (two) times daily.  Dispense: 20 tablet; Refill: 0  3. Essential (primary) hypertension Tolerating Amlodipine 5 mg qd with good control of BP. No peripheral edema, chest pains or palpitations. Recheck routine labs and follow up pending  reports. - CBC with Differential/Platelet - Comprehensive metabolic panel - TSH  4. Mixed hyperlipidemia Trying to work on low fat diet and take Omega-3 1000 mg caps one daily with Fenofibrate 160 mg qd. Recheck CMP and lipid panel with TSH.  - Comprehensive metabolic panel - Lipid panel - TSH  5. Anxiety disorder, unspecified type Stable but sleep is disturbed with recent cough and congestion with wheeze. Tolerating Klonopin 0.5 mg HS with good anxiety control. Recheck routine labs. - CBC with Differential/Platelet - Comprehensive metabolic panel - TSH

## 2016-08-18 ENCOUNTER — Telehealth: Payer: Self-pay

## 2016-08-18 LAB — COMPREHENSIVE METABOLIC PANEL
ALT: 15 IU/L (ref 0–44)
AST: 22 IU/L (ref 0–40)
Albumin/Globulin Ratio: 1.6 (ref 1.2–2.2)
Albumin: 4.6 g/dL (ref 3.5–4.8)
Alkaline Phosphatase: 69 IU/L (ref 39–117)
BUN/Creatinine Ratio: 14 (ref 10–24)
BUN: 16 mg/dL (ref 8–27)
Bilirubin Total: 0.4 mg/dL (ref 0.0–1.2)
CALCIUM: 9.4 mg/dL (ref 8.6–10.2)
CO2: 28 mmol/L (ref 20–29)
CREATININE: 1.13 mg/dL (ref 0.76–1.27)
Chloride: 102 mmol/L (ref 96–106)
GFR calc Af Amer: 73 mL/min/{1.73_m2} (ref >59)
GFR, EST NON AFRICAN AMERICAN: 63 mL/min/{1.73_m2} (ref >59)
GLOBULIN, TOTAL: 2.9 g/dL (ref 1.5–4.5)
Glucose: 98 mg/dL (ref 65–99)
Potassium: 4.8 mmol/L (ref 3.5–5.2)
SODIUM: 145 mmol/L — AB (ref 134–144)
Total Protein: 7.5 g/dL (ref 6.0–8.5)

## 2016-08-18 LAB — CBC WITH DIFFERENTIAL/PLATELET
Basophils Absolute: 0.1 10*3/uL (ref 0.0–0.2)
Basos: 1 %
EOS (ABSOLUTE): 0.3 10*3/uL (ref 0.0–0.4)
EOS: 3 %
HEMATOCRIT: 50.6 % (ref 37.5–51.0)
HEMOGLOBIN: 16.3 g/dL (ref 13.0–17.7)
IMMATURE GRANS (ABS): 0 10*3/uL (ref 0.0–0.1)
IMMATURE GRANULOCYTES: 0 %
LYMPHS: 21 %
Lymphocytes Absolute: 1.8 10*3/uL (ref 0.7–3.1)
MCH: 29.7 pg (ref 26.6–33.0)
MCHC: 32.2 g/dL (ref 31.5–35.7)
MCV: 92 fL (ref 79–97)
MONOCYTES: 11 %
Monocytes Absolute: 1 10*3/uL — ABNORMAL HIGH (ref 0.1–0.9)
NEUTROS PCT: 64 %
Neutrophils Absolute: 5.6 10*3/uL (ref 1.4–7.0)
Platelets: 387 10*3/uL — ABNORMAL HIGH (ref 150–379)
RBC: 5.49 x10E6/uL (ref 4.14–5.80)
RDW: 14.9 % (ref 12.3–15.4)
WBC: 8.8 10*3/uL (ref 3.4–10.8)

## 2016-08-18 LAB — LIPID PANEL
CHOL/HDL RATIO: 4.9 ratio (ref 0.0–5.0)
Cholesterol, Total: 187 mg/dL (ref 100–199)
HDL: 38 mg/dL — AB (ref >39)
LDL CALC: 104 mg/dL — AB (ref 0–99)
TRIGLYCERIDES: 226 mg/dL — AB (ref 0–149)
VLDL Cholesterol Cal: 45 mg/dL — ABNORMAL HIGH (ref 5–40)

## 2016-08-18 LAB — TSH: TSH: 2.79 u[IU]/mL (ref 0.450–4.500)

## 2016-08-18 NOTE — Telephone Encounter (Signed)
Patient advised. He verbalized understanding.  

## 2016-08-18 NOTE — Telephone Encounter (Signed)
-----   Message from Tamsen Roersennis E Chrismon, [place] sent at 08/18/2016  8:16 AM EDT ----- All tests essentially normal except triglycerides high and HDL low. Be sure to take the Fenofibrate 160 mg qd regularly and increase Omega-3 Fish Oil 1000 mg to one BID. Watch low fat diet and recheck levels in 3-4 months.

## 2016-09-30 ENCOUNTER — Other Ambulatory Visit: Payer: Self-pay | Admitting: Family Medicine

## 2016-09-30 DIAGNOSIS — F419 Anxiety disorder, unspecified: Secondary | ICD-10-CM

## 2016-10-01 ENCOUNTER — Telehealth: Payer: Self-pay | Admitting: Family Medicine

## 2016-10-01 NOTE — Telephone Encounter (Signed)
done

## 2016-10-01 NOTE — Telephone Encounter (Signed)
Left message for nurse to phone in to [facility] [address].

## 2016-10-01 NOTE — Telephone Encounter (Signed)
Please refill Mr. Andrew Hall's Clonazepam this morning.  He called yesterday for a refill but the computers were down. He wants to go get it this afternoon.

## 2016-10-01 NOTE — Telephone Encounter (Signed)
Please phone in refill of his Cl[facility]azepam to the [facility] [address]. Placed authorization in his chart.

## 2016-10-05 NOTE — Telephone Encounter (Signed)
RX called in at [facility] pharmacy  

## 2016-11-23 ENCOUNTER — Ambulatory Visit (INDEPENDENT_AMBULATORY_CARE_PROVIDER_SITE_OTHER): Payer: Medicare PPO | Admitting: Family Medicine

## 2016-11-23 ENCOUNTER — Encounter: Payer: Self-pay | Admitting: Family Medicine

## 2016-11-23 VITALS — BP 142/82 | HR 88 | Temp 98.3°F | Wt 189.0 lb

## 2016-11-23 DIAGNOSIS — E782 Mixed hyperlipidemia: Secondary | ICD-10-CM

## 2016-11-23 DIAGNOSIS — F419 Anxiety disorder, unspecified: Secondary | ICD-10-CM | POA: Diagnosis not present

## 2016-11-23 DIAGNOSIS — S39012A Strain of muscle, fascia and tendon of lower back, initial encounter: Secondary | ICD-10-CM

## 2016-11-23 DIAGNOSIS — Z23 Encounter for immunization: Secondary | ICD-10-CM | POA: Diagnosis not present

## 2016-11-23 DIAGNOSIS — I10 Essential (primary) hypertension: Secondary | ICD-10-CM | POA: Diagnosis not present

## 2016-11-23 DIAGNOSIS — J449 Chronic obstructive pulmonary disease, unspecified: Secondary | ICD-10-CM

## 2016-11-23 MED ORDER — FLUTICASONE-UMECLIDIN-VILANT 100-62.5-25 MCG/INH IN AEPB: 1.0000 | INHALATION_SPRAY | RESPIRATORY_TRACT | 0 refills | Status: AC

## 2016-11-23 MED ORDER — ALBUTEROL SULFATE HFA 108 (90 BASE) MCG/ACT IN AERS: 2.0000 | INHALATION_SPRAY | Freq: Four times a day (QID) | RESPIRATORY_TRACT | 2 refills | Status: DC | PRN

## 2016-11-23 NOTE — Progress Notes (Signed)
Patient: Andrew Hall Male    DOB: 1940/06/18   76 y.o.   MRN: [id number] Visit Date: 11/23/2016  Today's Provider: Dortha Kern, PA   Chief Complaint  Patient presents with  . Hypertension  . Anxiety   Subjective:    HPI  Hypertension, follow-up: BP Readings from Last 3 Encounters:  11/23/16 (!) 142/82  08/17/16 (!) 142/74  05/11/16 (!) 146/78   He was last seen for hypertension 3 months ago.  BP at that visit was 142/74. Management since that visit includes continue medications and follow a low fat diet.He reports fair compliance with treatment. He is not having side effects.  He is not exercising. He is adherent to low salt diet.   Outside blood pressures are being checked sometimes. He is experiencing none.  Patient denies chest pain, chest pressure/discomfort, irregular heart beat and palpitations.   Cardiovascular risk factors include advanced age (older than 52 for men, 59 for women), dyslipidemia, hypertension and male gender.  Use of agents associated with hypertension: none.   ------------------------------------------------------------------------    Lipid/Cholesterol, Follow-up:   Last seen for this 3 months ago.  Management since that visit includes continue medications and follow a low fat diet.  Last Lipid Panel: Lab Results  Component Value Date   CHOL 187 08/17/2016   HDL 38 (L) 08/17/2016   LDLCALC 104 (H) 08/17/2016   TRIG 226 (H) 08/17/2016   CHOLHDL 4.9 08/17/2016   He reports fair compliance with treatment. He is not having side effects.      Wt Readings from Last 3 Encounters:  08/17/16 190 lb 3.2 oz (86.3 kg)  05/11/16 186 lb 3.2 oz (84.5 kg)  02/03/16 179 lb 6.4 oz (81.4 kg)    ------------------------------------------------------------------------ Anxiety: 3 month follow up. Symptoms are stable. Patient will need refills on Clonazepam on 10/9. Last refill was 10/01/16 with 1 refill. Patient reports sleeping poorly.    Patient Active Problem List   Diagnosis Date Noted  . Chronic cough 12/03/2015  . Abnormal blood chemistry 06/27/2014  . Adjustment disorder with mixed anxiety and depressed mood 06/27/2014  . Anxiety disorder 06/27/2014  . Chest pain on exertion 06/27/2014  . COPD, mild (HCC) 06/27/2014  . Acid indigestion 06/27/2014  . Can't get food down 06/27/2014  . Breath shortness 06/27/2014  . Herpes zona 06/27/2014  . History of digestive disease 06/27/2014  . Hydrocele 06/27/2014  . HLD (hyperlipidemia) 06/27/2014  . Benign hypertension 06/27/2014  . BP (high blood pressure) 06/27/2014  . 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 Cerumen impaction 06/27/2014  . Pruritic dermatitis 06/27/2014  . Restless leg 06/27/2014  . 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 Unexplained weight loss 06/27/2014  . Inguinal hernia recurrent bilateral 08/28/2008  . 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 Malaise and fatigue 12/17/2006  . Accumulation of fluid in tissues 07/26/2006  . Essential (primary) hypertension 07/26/2006  . Tobacco use 07/26/2006   Past Surgical History:  Procedure Laterality Date  . HERNIA REPAIR Bilateral 08/08/2010   Family History  Problem Relation Age of Onset  . Heart disease Mother    No Known Allergies   Previous Medications   ALBUTEROL (PROVENTIL HFA;VENTOLIN HFA) 108 (90 BASE) MCG/ACT INHALER    Inhale 2 puffs into the lungs 4 (four) times daily as needed.   AMLODIPINE (NORVASC) 5 MG TABLET    Take 1 tablet (5 mg total) by mouth daily.   B COMPLEX VITAMINS (VITAMIN B COMPLEX PO)    Take by mouth daily.  CLONAZEPAM (KLONOPIN) 0.5 MG TABLET    TAKE 1 TABLET BY MOUTH AT BEDTIME   FENOFIBRATE 160 MG TABLET    Take 1 tablet (160 mg total) by mouth daily.   FLUTICASONE FUROATE-VILANTEROL (BREO ELLIPTA) 200-25 MCG/INH AEPB    Inhale 1 puff into the lungs daily.   FLUTICASONE-SALMETEROL    1 Inhaler 2 (two) times daily.   OMEGA 3 1000 MG CAPS    Take 1 capsule by mouth daily.    OMEPRAZOLE (PRILOSEC) 40 MG CAPSULE    Take 1 capsule by mouth daily.   SERTRALINE (ZOLOFT) 50 MG TABLET    TAKE ONE TABLET BY MOUTH AT BEDTIME   UMECLIDINIUM BROMIDE (INCRUSE ELLIPTA) 62.5 MCG/INH AEPB    Inhale 1 puff into the lungs daily.    Review of Systems  Constitutional: Negative.   Respiratory: Negative.   Cardiovascular: Negative.   Musculoskeletal: Positive for back pain.  Psychiatric/Behavioral: Positive for sleep disturbance. The patient is nervous/anxious.     Social History  Substance Use Topics  . Smoking status: Former Games developer  . Smokeless tobacco: Never Used  . Alcohol use No     Comment: stopped in 2012   Objective:   BP (!) 142/82 (BP Location: Right Arm, Patient Position: Sitting, Cuff Size: Normal)   Pulse 88   Temp 98.3 F (36.8 C) (Oral)   Wt 189 lb (85.7 kg)   SpO2 97%   BMI 28.32 kg/m   Physical Exam  Constitutional: He is oriented to person, place, and time. He appears well-developed and well-nourished. No distress.  HENT:  Head: Normocephalic and atraumatic.  Right Ear: Hearing and external ear normal.  Left Ear: Hearing and external ear normal.  Nose: Nose normal.  Mouth/Throat: Oropharynx is clear and moist.  Hearing diminished bilaterally (R<L).   Eyes: Conjunctivae and lids are normal. Right eye exhibits no discharge. Left eye exhibits no discharge. No scleral icterus.  Neck: Neck supple.  Cardiovascular: Normal rate and regular rhythm.   Pulmonary/Chest: Effort normal. No respiratory distress. He has wheezes.  Some rhonchi intermittently.  Abdominal: Soft. Bowel sounds are normal.  Musculoskeletal: Normal range of motion. He exhibits edema.  Pitting edema left lower leg and ankle with good pulses bilaterally ("drum fell on the lower leg 30 years ago").  Neurological: He is alert and oriented to person, place, and time.  Skin: Skin is intact. No lesion and no rash noted.  Psychiatric: He has a normal mood and affect. His speech is normal  and behavior is normal. Thought content normal.      Assessment & Plan:     1. Essential (primary) hypertension Tolerating Amlodipine 5 mg qd. Some edema of the left lower leg and ankle area from past trauma when a full drum was dropped against his left lower leg. Denies chest pains or palpitations. Will recheck CBC and CMP. Follow up pending reports. - CBC with Differential/Platelet - Comprehensive metabolic panel  2. Mixed hyperlipidemia Has gained 4 lbs since March 2018 despite trying to control fats in diet. Still taking Omega-3 Fish Oil 1000 mg qd with Fenofibrate 160 mg qd. Recheck labs and follow up pending reports. - Comprehensive metabolic panel - Lipid panel  3. Anxiety disorder, unspecified type The Clonazepam 0.5 mg at bedtime helps control anxiety and restless leg syndrome. Denies daytime drowsiness.   4. COPD (chronic obstructive pulmonary disease) with chronic bronchitis (HCC) Having more white thick foamy sputum production and wheezing. Albuterol helps but was breathing better when he was  using Breo and Incruse Ellipta inhalation therapy. Will check CBC for signs of infection and given sample of the Trelegy. Refill the Albuterol inhaler and recheck pending lab reports. - CBC with Differential/Platelet - Fluticasone-Umeclidin-Vilant (TRELEGY ELLIPTA) 100-62.5-25 MCG/INH AEPB; Inhale 1 puff into the lungs 1 day or 1 dose.  Dispense: 14 each; Refill: 0 - albuterol (PROVENTIL HFA;VENTOLIN HFA) 108 (90 Base) MCG/ACT inhaler; Inhale 2 puffs into the lungs 4 (four) times daily as needed.  Dispense: 18 g; Refill: 2  5. Need for influenza vaccination - Flu vaccine HIGH DOSE PF  6. Back strain, initial encounter Strained his back trying to lift his wife when she fell at home 11-08-16. Having tenderness in the lower lumbar midline area. Pain sharp with certain movements or trying to pull her up. Ibuprofen helps and feels it is better than the first couple days after the injury. Apply  moist heat and limit lifting. If no further improvement, will need x-ray evaluation in 10 days.

## 2016-11-24 LAB — LIPID PANEL
Cholesterol: 168 mg/dL (ref <200)
HDL: 33 mg/dL — AB (ref >40)
LDL Cholesterol (Calc): 109 mg/dL (calc) — ABNORMAL HIGH
Non-HDL Cholesterol (Calc): 135 mg/dL (calc) — ABNORMAL HIGH (ref <130)
Total CHOL/HDL Ratio: 5.1 (calc) — ABNORMAL HIGH (ref <5.0)
Triglycerides: 147 mg/dL (ref <150)

## 2016-11-24 LAB — COMPREHENSIVE METABOLIC PANEL
AG RATIO: 1.4 (calc) (ref 1.0–2.5)
ALBUMIN MSPROF: 4.3 g/dL (ref 3.6–5.1)
ALT: 12 U/L (ref 9–46)
AST: 20 U/L (ref 10–35)
Alkaline phosphatase (APISO): 70 U/L (ref 40–115)
BUN: 15 mg/dL (ref 7–25)
CHLORIDE: 102 mmol/L (ref 98–110)
CO2: 28 mmol/L (ref 20–32)
CREATININE: 1.14 mg/dL (ref 0.70–1.18)
Calcium: 9.2 mg/dL (ref 8.6–10.3)
GLOBULIN: 3 g/dL (ref 1.9–3.7)
GLUCOSE: 107 mg/dL (ref 65–139)
Potassium: 4.7 mmol/L (ref 3.5–5.3)
SODIUM: 140 mmol/L (ref 135–146)
TOTAL PROTEIN: 7.3 g/dL (ref 6.1–8.1)
Total Bilirubin: 0.6 mg/dL (ref 0.2–1.2)

## 2016-11-24 LAB — CBC WITH DIFFERENTIAL/PLATELET
BASOS PCT: 1 %
Basophils Absolute: 93 cells/uL (ref 0–200)
Eosinophils Absolute: 186 cells/uL (ref 15–500)
Eosinophils Relative: 2 %
HEMATOCRIT: 47.5 % (ref 38.5–50.0)
HEMOGLOBIN: 16 g/dL (ref 13.2–17.1)
LYMPHS ABS: 1925 {cells}/uL (ref 850–3900)
MCH: 31.1 pg (ref 27.0–33.0)
MCHC: 33.7 g/dL (ref 32.0–36.0)
MCV: 92.4 fL (ref 80.0–100.0)
MPV: 11.2 fL (ref 7.5–12.5)
Monocytes Relative: 7.6 %
NEUTROS ABS: 6389 {cells}/uL (ref 1500–7800)
Neutrophils Relative %: 68.7 %
PLATELETS: 351 10*3/uL (ref 140–400)
RBC: 5.14 10*6/uL (ref 4.20–5.80)
RDW: 12.9 % (ref 11.0–15.0)
Total Lymphocyte: 20.7 %
WBC: 9.3 10*3/uL (ref 3.8–10.8)
WBCMIX: 707 {cells}/uL (ref 200–950)

## 2016-11-30 ENCOUNTER — Telehealth: Payer: Self-pay | Admitting: Family Medicine

## 2016-11-30 ENCOUNTER — Other Ambulatory Visit: Payer: Self-pay | Admitting: Family Medicine

## 2016-11-30 DIAGNOSIS — F419 Anxiety disorder, unspecified: Secondary | ICD-10-CM

## 2016-11-30 MED ORDER — CLONAZEPAM 0.5 MG PO TABS: 0.5000 mg | ORAL_TABLET | Freq: Every day | ORAL | 1 refills | Status: DC

## 2016-11-30 NOTE — Telephone Encounter (Signed)
Phone in refills as authorized in chart.

## 2016-11-30 NOTE — Telephone Encounter (Signed)
RX called in at [facility] pharmacy. Patient advised.  

## 2016-11-30 NOTE — Telephone Encounter (Signed)
Pt contacted office for refill request on the following medications:  clonazePAM (KLONOPIN) 0.5 MG tablet  [facility] [address]  CB#[id number]/MW

## 2017-01-25 ENCOUNTER — Telehealth: Payer: Self-pay | Admitting: Family Medicine

## 2017-01-25 ENCOUNTER — Other Ambulatory Visit: Payer: Self-pay | Admitting: Family Medicine

## 2017-01-25 DIAGNOSIS — F419 Anxiety disorder, unspecified: Secondary | ICD-10-CM

## 2017-01-25 MED ORDER — SERTRALINE HCL 50 MG PO TABS: 50.0000 mg | ORAL_TABLET | Freq: Every day | ORAL | 1 refills | Status: DC

## 2017-01-25 MED ORDER — CLONAZEPAM 0.5 MG PO TABS: 0.5000 mg | ORAL_TABLET | Freq: Every day | ORAL | 1 refills | Status: DC

## 2017-01-25 NOTE — Telephone Encounter (Signed)
Needs refills on Clonazepam and Sertraline 50 mg.  Called to [facility] [address]

## 2017-01-25 NOTE — Telephone Encounter (Signed)
Phone in refill of Clonazepam 0.5 mg one tablet at bedtime #30 & 1 refill and Sertraline 50 mg one at bedtime #90 & 1 refill. Remind patient to schedule Medicare AWE.

## 2017-01-25 NOTE — Telephone Encounter (Signed)
[facility] Left patient a message advising hm to return call to schedule AWE with McKenzie and CPE with Maurine Ministerennis.

## 2017-03-01 ENCOUNTER — Other Ambulatory Visit: Payer: Self-pay | Admitting: Family Medicine

## 2017-03-01 ENCOUNTER — Telehealth: Payer: Self-pay | Admitting: Family Medicine

## 2017-03-01 DIAGNOSIS — E782 Mixed hyperlipidemia: Secondary | ICD-10-CM

## 2017-03-01 MED ORDER — FENOFIBRATE 160 MG PO TABS: 160.0000 mg | ORAL_TABLET | Freq: Every day | ORAL | 6 refills | Status: DC

## 2017-03-01 NOTE — Telephone Encounter (Signed)
Done

## 2017-03-01 NOTE — Telephone Encounter (Signed)
Needs refill Fenofibrate called into [facility] [address]

## 2017-03-15 ENCOUNTER — Encounter: Payer: Self-pay | Admitting: Family Medicine

## 2017-03-15 ENCOUNTER — Ambulatory Visit: Payer: Medicare PPO | Admitting: Family Medicine

## 2017-03-15 VITALS — BP 140/72 | HR 82 | Temp 97.9°F | Wt 190.4 lb

## 2017-03-15 DIAGNOSIS — F419 Anxiety disorder, unspecified: Secondary | ICD-10-CM | POA: Diagnosis not present

## 2017-03-15 DIAGNOSIS — E782 Mixed hyperlipidemia: Secondary | ICD-10-CM

## 2017-03-15 DIAGNOSIS — I10 Essential (primary) hypertension: Secondary | ICD-10-CM

## 2017-03-15 NOTE — Progress Notes (Signed)
Patient: Andrew Hall Male    DOB: January 15, 1941   77 y.o.   MRN: [id number] Visit Date: 03/15/2017  Today's Provider: Dortha Kernennis Chrismon, PA   Chief Complaint  Patient presents with  . Hypertension  . Hyperlipidemia  . Anxiety  . Follow-up   Subjective:    HPI  Hypertension, follow-up:    BP Readings from Last 3 Encounters:  11/23/16 (!) 142/82  08/17/16 (!) 142/74  05/11/16 (!) 146/78   Hewas last seen for hypertension 3 monthsago.  BP at that visit was 142/82. Management since that visit includes continue medications and follow a low fat diet. Hereports faircompliance with treatment. Heis nothaving side effects.  Heis notexercising. Heisadherent to low salt diet.  Outside blood pressures are being checked sometimes. Heis experiencing none.  Patient denies chest pain, chest pressure/discomfort, irregular heart beat and palpitations.  Cardiovascular risk factors include advanced age (older than 8155 for men, 5065 for women), dyslipidemia, hypertension and male gender.  Use of agents associated with hypertension: none.   ------------------------------------------------------------------------   Lipid/Cholesterol, Follow-up:   Last seen for this 3 monthsago.  Management since that visit includes continue medications and follow a low fat diet.  Last Lipid Panel: RecentLabs       Lab Results  Component Value Date   CHOL 187 08/17/2016   HDL 38 (L) 08/17/2016   LDLCALC 104 (H) 08/17/2016   TRIG 226 (H) 08/17/2016   CHOLHDL 4.9 08/17/2016     Hereports faircompliance with treatment. Heis nothaving side effects.      Wt Readings from Last 3 Encounters:  08/17/16 190 lb 3.2 oz (86.3 kg)  05/11/16 186 lb 3.2 oz (84.5 kg)  02/03/16 179 lb 6.4 oz (81.4 kg)    ------------------------------------------------------------------------ Anxiety: 3 month follow up. Symptoms are stable. Last Clonazepam refill was 01/25/17 with 1  refill. He does not need refills at this time.   History reviewed. No pertinent past medical history. Patient Active Problem List   Diagnosis Date Noted  . Chronic cough 12/03/2015  . Abnormal blood chemistry 06/27/2014  . Adjustment disorder with mixed anxiety and depressed mood 06/27/2014  . Anxiety disorder 06/27/2014  . Chest pain on exertion 06/27/2014  . COPD, mild (HCC) 06/27/2014  . Acid indigestion 06/27/2014  . Can't get food down 06/27/2014  . Breath shortness 06/27/2014  . Herpes zona 06/27/2014  . History of digestive disease 06/27/2014  . Hydrocele 06/27/2014  . HLD (hyperlipidemia) 06/27/2014  . Benign hypertension 06/27/2014  . BP (high blood pressure) 06/27/2014  . 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 Cerumen impaction 06/27/2014  . Pruritic dermatitis 06/27/2014  . Restless leg 06/27/2014  . 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 Unexplained weight loss 06/27/2014  . Inguinal hernia recurrent bilateral 08/28/2008  . 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 Malaise and fatigue 12/17/2006  . Accumulation of fluid in tissues 07/26/2006  . Essential (primary) hypertension 07/26/2006  . Tobacco use 07/26/2006   Past Surgical History:  Procedure Laterality Date  . HERNIA REPAIR Bilateral 08/08/2010   Family History  Problem Relation Age of Onset  . Heart disease Mother    No Known Allergies  Current Outpatient Medications:  .  albuterol (PROVENTIL HFA;VENTOLIN HFA) 108 (90 Base) MCG/ACT inhaler, Inhale 2 puffs into the lungs 4 (four) times daily as needed., Disp: 18 g, Rfl: 2 .  amLODipine (NORVASC) 5 MG tablet, Take 1 tablet (5 mg total) by mouth daily., Disp: 30 tablet, Rfl: 12 .  B Complex  Vitamins (VITAMIN B COMPLEX PO), Take by mouth daily., Disp: , Rfl:  .  clonazePAM (KLONOPIN) 0.5 MG tablet, Take 1 tablet (0.5 mg total) by mouth at bedtime., Disp: 30 tablet, Rfl: 1 .  fenofibrate 160 MG tablet, Take 1 tablet (160 mg total) by mouth daily., Disp: 30 tablet, Rfl:  6 .  Omega 3 1000 MG CAPS, Take 1 capsule by mouth daily., Disp: , Rfl:  .  omeprazole (PRILOSEC) 40 MG capsule, Take 1 capsule by mouth daily., Disp: , Rfl:  .  sertraline (ZOLOFT) 50 MG tablet, Take 1 tablet (50 mg total) by mouth at bedtime., Disp: 90 tablet, Rfl: 1  Review of Systems  Constitutional: Negative.   Respiratory: Negative.   Cardiovascular: Negative.     Social History   Tobacco Use  . Smoking status: Former Games developer  . Smokeless tobacco: Never Used  Substance Use Topics  . Alcohol use: No    Alcohol/week: 0.0 oz    Comment: stopped in 2012   Objective:   BP 140/72 (BP Location: Right Arm, Patient Position: Sitting, Cuff Size: Normal)   Pulse 82   Temp 97.9 F (36.6 C) (Oral)   Wt 190 lb 6.4 oz (86.4 kg)   SpO2 98%   BMI 28.53 kg/m    Physical Exam  Constitutional: He is oriented to person, place, and time. He appears well-developed and well-nourished. No distress.  HENT:  Head: Normocephalic and atraumatic.  Right Ear: Hearing and external ear normal.  Left Ear: Hearing and external ear normal.  Nose: Nose normal.  Mouth/Throat: Oropharynx is clear and moist.  Eyes: Conjunctivae and lids are normal. Right eye exhibits no discharge. Left eye exhibits no discharge. No scleral icterus.  Neck: Normal range of motion. Neck supple.  Cardiovascular: Normal rate and regular rhythm.  Pulmonary/Chest: Effort normal and breath sounds normal. No respiratory distress.  Abdominal: Soft. Bowel sounds are normal.  Neurological: He is alert and oriented to person, place, and time.  Skin: Skin is intact. No lesion and no rash noted.  Psychiatric: His speech is normal.      Assessment & Plan:     1. Essential (primary) hypertension Well controlled with the Amlodipine 5 mg qd. No chest pains or palpitations. Continue present dosage, limiting salt & caffeine intake. Recheck CMP, CBC, Lipid Panel and TSH. Follow up pending reports. - Comprehensive metabolic panel - CBC  with Differential/Platelet - Lipid panel - TSH  2. Anxiety disorder, unspecified type Feels the Sertraline 50 mg hs with occasional use of Clonazepam 0.5 mg hs helps control anxiety/panic sensations. Recheck labs and continue present dosages. No suicidal ideation. - CBC with Differential/Platelet - TSH  3. Mixed hyperlipidemia Still taking the Omega-3 1000 mg qd with Fenofibrate 160 mg qd and low fat diet. Denies side effects. Will check CMP, lipid panel and TSH. Follow up pending reports. - Comprehensive metabolic panel - Lipid panel - TSH       Dortha Kern, PA  [facility]

## 2017-03-18 DIAGNOSIS — F419 Anxiety disorder, unspecified: Secondary | ICD-10-CM | POA: Diagnosis not present

## 2017-03-18 DIAGNOSIS — I10 Essential (primary) hypertension: Secondary | ICD-10-CM | POA: Diagnosis not present

## 2017-03-18 DIAGNOSIS — E782 Mixed hyperlipidemia: Secondary | ICD-10-CM | POA: Diagnosis not present

## 2017-03-19 ENCOUNTER — Telehealth: Payer: Self-pay

## 2017-03-19 DIAGNOSIS — D72829 Elevated white blood cell count, unspecified: Secondary | ICD-10-CM

## 2017-03-19 LAB — COMPREHENSIVE METABOLIC PANEL
A/G RATIO: 1.5 (ref 1.2–2.2)
ALBUMIN: 4 g/dL (ref 3.5–4.8)
ALT: 16 IU/L (ref 0–44)
AST: 21 IU/L (ref 0–40)
Alkaline Phosphatase: 66 IU/L (ref 39–117)
BILIRUBIN TOTAL: 0.8 mg/dL (ref 0.0–1.2)
BUN / CREAT RATIO: 16 (ref 10–24)
BUN: 20 mg/dL (ref 8–27)
CHLORIDE: 101 mmol/L (ref 96–106)
CO2: 26 mmol/L (ref 20–29)
Calcium: 9.2 mg/dL (ref 8.6–10.2)
Creatinine, Ser: 1.25 mg/dL (ref 0.76–1.27)
GFR, EST AFRICAN AMERICAN: 64 mL/min/{1.73_m2} (ref >59)
GFR, EST NON AFRICAN AMERICAN: 55 mL/min/{1.73_m2} — AB (ref >59)
Globulin, Total: 2.6 g/dL (ref 1.5–4.5)
Glucose: 99 mg/dL (ref 65–99)
POTASSIUM: 4.9 mmol/L (ref 3.5–5.2)
Sodium: 144 mmol/L (ref 134–144)
TOTAL PROTEIN: 6.6 g/dL (ref 6.0–8.5)

## 2017-03-19 LAB — CBC WITH DIFFERENTIAL/PLATELET
BASOS ABS: 0.1 10*3/uL (ref 0.0–0.2)
BASOS: 1 %
EOS (ABSOLUTE): 0.2 10*3/uL (ref 0.0–0.4)
Eos: 2 %
HEMOGLOBIN: 15.3 g/dL (ref 13.0–17.7)
Hematocrit: 45.8 % (ref 37.5–51.0)
Immature Grans (Abs): 0 10*3/uL (ref 0.0–0.1)
Immature Granulocytes: 0 %
LYMPHS ABS: 2.2 10*3/uL (ref 0.7–3.1)
Lymphs: 19 %
MCH: 30.9 pg (ref 26.6–33.0)
MCHC: 33.4 g/dL (ref 31.5–35.7)
MCV: 93 fL (ref 79–97)
MONOCYTES: 6 %
Monocytes Absolute: 0.7 10*3/uL (ref 0.1–0.9)
NEUTROS ABS: 8.4 10*3/uL — AB (ref 1.4–7.0)
Neutrophils: 72 %
Platelets: 311 10*3/uL (ref 150–379)
RBC: 4.95 x10E6/uL (ref 4.14–5.80)
RDW: 14.5 % (ref 12.3–15.4)
WBC: 11.6 10*3/uL — ABNORMAL HIGH (ref 3.4–10.8)

## 2017-03-19 LAB — LIPID PANEL
CHOL/HDL RATIO: 4.1 ratio (ref 0.0–5.0)
CHOLESTEROL TOTAL: 150 mg/dL (ref 100–199)
HDL: 37 mg/dL — ABNORMAL LOW (ref >39)
LDL CALC: 87 mg/dL (ref 0–99)
Triglycerides: 130 mg/dL (ref 0–149)
VLDL Cholesterol Cal: 26 mg/dL (ref 5–40)

## 2017-03-19 LAB — TSH: TSH: 1.5 u[IU]/mL (ref 0.450–4.500)

## 2017-03-19 MED ORDER — AMOXICILLIN 875 MG PO TABS: 875.0000 mg | ORAL_TABLET | Freq: Two times a day (BID) | ORAL | 0 refills | Status: DC

## 2017-03-19 NOTE — Telephone Encounter (Signed)
Patient advised. He verbalized understanding. [facility]. CBC ordered as future labs. Will release and print lab slip when patient comes in 2 weeks to recheck labs.

## 2017-03-19 NOTE — Telephone Encounter (Signed)
-----   Message from Tamsen Roersennis E Chrismon, [place] sent at 03/19/2017  8:13 AM EST ----- All blood tests in good shape. Cholesterol panel showing improvement in risk ratio and HDL better. Normal kidney and liver function. Slightly elevated WBC count but no anemia or signs of dehydration. If any respiratory congestion, may need Amoxicillin 875 mg BID #20 and recheck WBC count in 2 weeks.

## 2017-03-19 NOTE — Telephone Encounter (Signed)
[facility]   ----- Message from Tamsen Roersennis E Chrismon, PA sent at 03/19/2017  8:13 AM EST ----- All blood tests in good shape. Cholesterol panel showing improvement in risk ratio and HDL better. Normal kidney and liver function. Slightly elevated WBC count but no anemia or signs of dehydration. If any respiratory congestion, may need Amoxicillin 875 mg BID #20 and recheck WBC count in 2 weeks.

## 2017-03-19 NOTE — Telephone Encounter (Signed)
Pt returned call for lab results. Please advise. Thanks TNP °

## 2017-03-30 ENCOUNTER — Telehealth: Payer: Self-pay | Admitting: Family Medicine

## 2017-03-30 ENCOUNTER — Other Ambulatory Visit: Payer: Self-pay | Admitting: Family Medicine

## 2017-03-30 DIAGNOSIS — F419 Anxiety disorder, unspecified: Secondary | ICD-10-CM

## 2017-03-30 MED ORDER — CLONAZEPAM 0.5 MG PO TABS: 0.5000 mg | ORAL_TABLET | Freq: Every day | ORAL | 1 refills | Status: DC

## 2017-03-30 MED ORDER — FLUTICASONE-UMECLIDIN-VILANT 100-62.5-25 MCG/INH IN AEPB: 1.0000 | INHALATION_SPRAY | Freq: Every day | RESPIRATORY_TRACT | 3 refills | Status: AC

## 2017-03-30 NOTE — Telephone Encounter (Signed)
May have a couple if we have them. If we don't, can send prescription to the pharmacy (Trelegy one inhalation daily #1 inhaler and 3 RF).

## 2017-03-30 NOTE — Telephone Encounter (Signed)
[facility] He is also requesting a refill on Clonazepam. Please advise.

## 2017-03-30 NOTE — Telephone Encounter (Signed)
Phone in clonazepam as listed in medications.

## 2017-03-30 NOTE — Telephone Encounter (Signed)
Patient is in need of Trelegy samples if we have them.

## 2017-03-30 NOTE — Telephone Encounter (Signed)
[facility]

## 2017-03-31 ENCOUNTER — Telehealth: Payer: Self-pay | Admitting: Family Medicine

## 2017-04-08 ENCOUNTER — Other Ambulatory Visit: Payer: Self-pay

## 2017-04-08 DIAGNOSIS — D72829 Elevated white blood cell count, unspecified: Secondary | ICD-10-CM

## 2017-04-08 NOTE — Telephone Encounter (Signed)
Scheduled for 07/12/2017

## 2017-04-09 LAB — CBC WITH DIFFERENTIAL/PLATELET
BASOS ABS: 0.1 10*3/uL (ref 0.0–0.2)
Basos: 1 %
EOS (ABSOLUTE): 0.3 10*3/uL (ref 0.0–0.4)
Eos: 2 %
Hematocrit: 46.3 % (ref 37.5–51.0)
Hemoglobin: 14.9 g/dL (ref 13.0–17.7)
Immature Grans (Abs): 0 10*3/uL (ref 0.0–0.1)
Immature Granulocytes: 0 %
LYMPHS ABS: 2.1 10*3/uL (ref 0.7–3.1)
Lymphs: 18 %
MCH: 30.3 pg (ref 26.6–33.0)
MCHC: 32.2 g/dL (ref 31.5–35.7)
MCV: 94 fL (ref 79–97)
MONOS ABS: 1.1 10*3/uL — AB (ref 0.1–0.9)
Monocytes: 9 %
Neutrophils Absolute: 8.6 10*3/uL — ABNORMAL HIGH (ref 1.4–7.0)
Neutrophils: 70 %
Platelets: 383 10*3/uL — ABNORMAL HIGH (ref 150–379)
RBC: 4.92 x10E6/uL (ref 4.14–5.80)
RDW: 14.7 % (ref 12.3–15.4)
WBC: 12.1 10*3/uL — ABNORMAL HIGH (ref 3.4–10.8)

## 2017-04-12 ENCOUNTER — Ambulatory Visit: Payer: Medicare PPO | Admitting: Family Medicine

## 2017-04-12 ENCOUNTER — Ambulatory Visit
Admission: RE | Admit: 2017-04-12 | Discharge: 2017-04-12 | Disposition: A | Discharge status: Home / Self Care | Payer: Medicare PPO | Source: Ambulatory Visit | Attending: Family Medicine | Admitting: Family Medicine

## 2017-04-12 ENCOUNTER — Encounter: Payer: Self-pay | Admitting: Family Medicine

## 2017-04-12 VITALS — BP 138/70 | HR 80 | Temp 98.2°F | Wt 194.0 lb

## 2017-04-12 DIAGNOSIS — D72828 Other elevated white blood cell count: Secondary | ICD-10-CM | POA: Diagnosis not present

## 2017-04-12 DIAGNOSIS — J209 Acute bronchitis, unspecified: Secondary | ICD-10-CM | POA: Insufficient documentation

## 2017-04-12 DIAGNOSIS — R0602 Shortness of breath: Secondary | ICD-10-CM | POA: Diagnosis not present

## 2017-04-12 DIAGNOSIS — J440 Chronic obstructive pulmonary disease with acute lower respiratory infection: Secondary | ICD-10-CM | POA: Insufficient documentation

## 2017-04-12 DIAGNOSIS — R05 Cough: Secondary | ICD-10-CM | POA: Diagnosis not present

## 2017-04-12 MED ORDER — LEVOFLOXACIN 500 MG PO TABS: 500.0000 mg | ORAL_TABLET | Freq: Every day | ORAL | 0 refills | Status: DC

## 2017-04-12 NOTE — Progress Notes (Signed)
Patient: Andrew Hall Male    DOB: [birth year]   77 y.o.   MRN: [id number] Visit Date: 04/12/2017  Today's Provider: Dortha Kern, PA   Chief Complaint  Patient presents with  . follow up lab results   Subjective:    HPI  Elevated WBC:  Patient presents today for follow up on elevated WBC. Labs done on 03/18/17 showed WBC at 11.6. Patient started Amoxicillin 875 mg BID for 10 days. He reports good compliance with treatment plan.  Labs were rechecked on 04/08/17 and WBC was 12.1  Patient advised to follow up to recheck labs and get a CXR. Patient states he is still congested and coughing which is worse at night.   Patient Active Problem List   Diagnosis Date Noted  . Chronic cough 12/03/2015  . Abnormal blood chemistry 06/27/2014  . Adjustment disorder with mixed anxiety and depressed mood 06/27/2014  . Anxiety disorder 06/27/2014  . Chest pain on exertion 06/27/2014  . COPD, mild (HCC) 06/27/2014  . Acid indigestion 06/27/2014  . Can't get food down 06/27/2014  . Breath shortness 06/27/2014  . Herpes zona 06/27/2014  . History of digestive disease 06/27/2014  . Hydrocele 06/27/2014  . HLD (hyperlipidemia) 06/27/2014  . Benign hypertension 06/27/2014  . BP (high blood pressure) 06/27/2014  . 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 Cerumen impaction 06/27/2014  . Pruritic dermatitis 06/27/2014  . Restless leg 06/27/2014  . 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 Unexplained weight loss 06/27/2014  . Inguinal hernia recurrent bilateral 08/28/2008  . 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 Malaise and fatigue 12/17/2006  . Accumulation of fluid in tissues 07/26/2006  . Essential (primary) hypertension 07/26/2006  . Tobacco use 07/26/2006   Past Surgical History:  Procedure Laterality Date  . HERNIA REPAIR Bilateral 08/08/2010   Family History  Problem Relation Age of Onset  . Heart disease Mother    No Known Allergies  Current Outpatient Medications:  .  albuterol  (PROVENTIL HFA;VENTOLIN HFA) 108 (90 Base) MCG/ACT inhaler, Inhale 2 puffs into the lungs 4 (four) times daily as needed., Disp: 18 g, Rfl: 2 .  amLODipine (NORVASC) 5 MG tablet, Take 1 tablet (5 mg total) by mouth daily., Disp: 30 tablet, Rfl: 12 .  B Complex Vitamins (VITAMIN B COMPLEX PO), Take by mouth daily., Disp: , Rfl:  .  clonazePAM (KLONOPIN) 0.5 MG tablet, Take 1 tablet (0.5 mg total) by mouth at bedtime., Disp: 30 tablet, Rfl: 1 .  fenofibrate 160 MG tablet, Take 1 tablet (160 mg total) by mouth daily., Disp: 30 tablet, Rfl: 6 .  Fluticasone-Umeclidin-Vilant (TRELEGY ELLIPTA) 100-62.5-25 MCG/INH AEPB, Inhale 1 puff into the lungs daily., Disp: 1 each, Rfl: 3 .  Omega 3 1000 MG CAPS, Take 1 capsule by mouth daily., Disp: , Rfl:  .  omeprazole (PRILOSEC) 40 MG capsule, Take 1 capsule by mouth daily., Disp: , Rfl:  .  sertraline (ZOLOFT) 50 MG tablet, Take 1 tablet (50 mg total) by mouth at bedtime., Disp: 90 tablet, Rfl: 1  Review of Systems  Constitutional: Negative.   HENT: Positive for congestion.   Respiratory: Positive for cough, shortness of breath and wheezing.   Cardiovascular: Negative.    Social History   Tobacco Use  . Smoking status: Former Games developer  . Smokeless tobacco: Never Used  Substance Use Topics  . Alcohol use: No    Alcohol/week: 0.0 oz    Comment: stopped in 2012   Objective:   BP 138/70 (  BP Location: Right Arm, Patient Position: Sitting, Cuff Size: Normal)   Pulse 80   Temp 98.2 F (36.8 C) (Oral)   Wt 194 lb (88 kg)   SpO2 97%   BMI 29.07 kg/m  Wt Readings from Last 3 Encounters:  04/12/17 194 lb (88 kg)  03/15/17 190 lb 6.4 oz (86.4 kg)  11/23/16 189 lb (85.7 kg)    Physical Exam  Constitutional: He is oriented to person, place, and time. He appears well-developed and well-nourished. No distress.  HENT:  Head: Normocephalic and atraumatic.  Right Ear: Hearing normal.  Left Ear: Hearing normal.  Nose: Nose normal.  Eyes: Conjunctivae and  lids are normal. Right eye exhibits no discharge. Left eye exhibits no discharge. No scleral icterus.  Neck: Neck supple.  Cardiovascular: Normal rate and regular rhythm.  Pulmonary/Chest: Effort normal. No respiratory distress.  Distant breath sounds.  Abdominal: Soft. Bowel sounds are normal.  Musculoskeletal: He exhibits edema.  Left lower leg secondary to past trauma.  Neurological: He is alert and oriented to person, place, and time.  Skin: Skin is intact. No lesion and no rash noted.  Psychiatric: He has a normal mood and affect. His speech is normal and behavior is normal. Thought content normal.      Assessment & Plan:     1. Acute bronchitis with COPD (HCC) Finished the Amoxicillin with continued cough and sputum production. Trelegy and Albuterol helping to control wheezing and dyspnea. WBC count on 04-08-17 was 12,100 with increase in neutrophils compared to 11,600 on 03-18-17. Denies fever but has some rhinorrhea. May use Claritin and recheck CBC with diff. Get CXR and switch to Levaquin 500 mg qd. Last spirometry on 08-17-16 showed moderate severe restrictive disease. Recheck pending reports. - CBC with Differential/Platelet - DG Chest 2 View - levofloxacin (LEVAQUIN) 500 MG tablet; Take 1 tablet (500 mg total) by mouth daily.  Dispense: 7 tablet; Refill: 0  2. Other elevated white blood cell (WBC) count Slowly worsening WBC count since April 2018. Recheck CBC and follow up pending lab reports. - CBC with Differential/Platelet       Dortha Kernennis Chrismon, PA  [facility]

## 2017-04-13 ENCOUNTER — Other Ambulatory Visit: Payer: Self-pay | Admitting: Family Medicine

## 2017-04-13 ENCOUNTER — Telehealth: Payer: Self-pay

## 2017-04-13 ENCOUNTER — Telehealth: Payer: Self-pay | Admitting: Family Medicine

## 2017-04-13 DIAGNOSIS — J449 Chronic obstructive pulmonary disease, unspecified: Secondary | ICD-10-CM

## 2017-04-13 LAB — CBC WITH DIFFERENTIAL/PLATELET
Basophils Absolute: 0.1 10*3/uL (ref 0.0–0.2)
Basos: 1 %
EOS (ABSOLUTE): 0.3 10*3/uL (ref 0.0–0.4)
Eos: 3 %
HEMATOCRIT: 48.1 % (ref 37.5–51.0)
Hemoglobin: 15.9 g/dL (ref 13.0–17.7)
Immature Grans (Abs): 0 10*3/uL (ref 0.0–0.1)
Immature Granulocytes: 0 %
LYMPHS ABS: 1.7 10*3/uL (ref 0.7–3.1)
Lymphs: 17 %
MCH: 31.7 pg (ref 26.6–33.0)
MCHC: 33.1 g/dL (ref 31.5–35.7)
MCV: 96 fL (ref 79–97)
MONOCYTES: 11 %
Monocytes Absolute: 1.1 10*3/uL — ABNORMAL HIGH (ref 0.1–0.9)
NEUTROS ABS: 6.8 10*3/uL (ref 1.4–7.0)
Neutrophils: 68 %
Platelets: 395 10*3/uL — ABNORMAL HIGH (ref 150–379)
RBC: 5.02 x10E6/uL (ref 4.14–5.80)
RDW: 14.9 % (ref 12.3–15.4)
WBC: 9.9 10*3/uL (ref 3.4–10.8)

## 2017-04-13 MED ORDER — ALBUTEROL SULFATE HFA 108 (90 BASE) MCG/ACT IN AERS: 2.0000 | INHALATION_SPRAY | Freq: Four times a day (QID) | RESPIRATORY_TRACT | 3 refills | Status: AC | PRN

## 2017-04-13 NOTE — Telephone Encounter (Signed)
-----   Message from Kohl'sDennis E Chrismon, [place] sent at 04/13/2017  7:59 AM EST ----- WBC count back to normal. Awaiting final report of chest x-ray. Can try to refill Ventolin-HFA inhaler instead of Albuterol-HFA if insurance will allow. Finish antibiotics.

## 2017-04-13 NOTE — Telephone Encounter (Signed)
-----   Message from Tamsen Roersennis E Chrismon, [place] sent at 04/13/2017  9:07 AM EST ----- No sign of pneumonia on chest x-ray or CBC. Congestion and cough probably due to COPD with wheezing. Recheck in 3 months.

## 2017-04-13 NOTE — Telephone Encounter (Signed)
Patient has to have a prior authorization for his Albuterol Inhaler.  Need to call [phone]  [facility]   Please let pt. Know when or if this is approved.

## 2017-04-13 NOTE — Progress Notes (Signed)
Insurance [facility]([employer]) stated the Ventolin inhaler is on their formulary. Changed prescription to name brand Ventolin.

## 2017-04-13 NOTE — Telephone Encounter (Signed)
Called [employer] at the above number and they stated the name brand Ventolin inhaler (which is Albuterol and what he has been using) is on their formulary. Changed prescription to brand name Ventolin and sent to [facility] [address]

## 2017-04-13 NOTE — Telephone Encounter (Signed)
LMTCB

## 2017-04-14 NOTE — Telephone Encounter (Signed)
Patient advised.

## 2017-05-24 ENCOUNTER — Telehealth: Payer: Self-pay | Admitting: Family Medicine

## 2017-05-24 ENCOUNTER — Other Ambulatory Visit: Payer: Self-pay | Admitting: Family Medicine

## 2017-05-24 DIAGNOSIS — F419 Anxiety disorder, unspecified: Secondary | ICD-10-CM

## 2017-05-24 DIAGNOSIS — I10 Essential (primary) hypertension: Secondary | ICD-10-CM

## 2017-05-24 MED ORDER — AMLODIPINE BESYLATE 5 MG PO TABS: 5.0000 mg | ORAL_TABLET | Freq: Every day | ORAL | 12 refills | Status: DC

## 2017-05-24 MED ORDER — CLONAZEPAM 0.5 MG PO TABS: 0.5000 mg | ORAL_TABLET | Freq: Every day | ORAL | 1 refills | Status: DC

## 2017-05-24 NOTE — Telephone Encounter (Signed)
Done

## 2017-05-24 NOTE — Telephone Encounter (Signed)
Patient needs refills on amlodipine 5 mg. And Clonazepam 0.5 mg. To [facility] on [address].

## 2017-07-12 ENCOUNTER — Ambulatory Visit (INDEPENDENT_AMBULATORY_CARE_PROVIDER_SITE_OTHER): Payer: Medicare PPO | Admitting: Family Medicine

## 2017-07-12 ENCOUNTER — Ambulatory Visit (INDEPENDENT_AMBULATORY_CARE_PROVIDER_SITE_OTHER): Payer: Medicare PPO

## 2017-07-12 VITALS — BP 148/72 | Temp 98.8°F | Wt 189.4 lb

## 2017-07-12 VITALS — BP 142/70 | HR 70 | Temp 98.8°F | Ht 69.0 in | Wt 189.4 lb

## 2017-07-12 DIAGNOSIS — Q394 Esophageal web: Secondary | ICD-10-CM

## 2017-07-12 DIAGNOSIS — F4323 Adjustment disorder with mixed anxiety and depressed mood: Secondary | ICD-10-CM | POA: Diagnosis not present

## 2017-07-12 DIAGNOSIS — J449 Chronic obstructive pulmonary disease, unspecified: Secondary | ICD-10-CM | POA: Diagnosis not present

## 2017-07-12 DIAGNOSIS — I10 Essential (primary) hypertension: Secondary | ICD-10-CM | POA: Diagnosis not present

## 2017-07-12 DIAGNOSIS — Z0000 Encounter for general adult medical examination without abnormal findings: Secondary | ICD-10-CM | POA: Diagnosis not present

## 2017-07-12 DIAGNOSIS — E782 Mixed hyperlipidemia: Secondary | ICD-10-CM | POA: Diagnosis not present

## 2017-07-12 NOTE — Progress Notes (Signed)
Patient: Andrew Hall, Male    DOB: [birth year], 76 y.o.   MRN: [id number] Visit Date: 07/12/2017  Today's Provider: Dortha Kern, PA   Chief Complaint  Patient presents with  . Annual Exam   Subjective:     Complete Physical LOGUN COLAVITO is a 77 y.o. male. He feels well. He reports he is not actively exercising . He reports he is sleeping poorly, patient states that he is sleeping up to 8 hrs a night.    Hypertension, follow-up:  BP Readings from Last 3 Encounters:  07/12/17 (!) 148/72  07/12/17 (!) 142/70  04/12/17 138/70    He was last seen for hypertension 4 months ago.  BP at that visit was 140/72. Management changes since that visit include none. He reports excellent compliance with treatment. He is not having side effects. He is not exercising. He is not adherent to low salt diet.   Outside blood pressures are stable. He is experiencing none.  Patient denies chest pain, chest pressure/discomfort, claudication, exertional chest pressure/discomfort, fatigue, irregular heart beat, lower extremity edema, near-syncope, orthopnea, palpitations, syncope and tachypnea.   Cardiovascular risk factors include advanced age (older than 11 for men, 21 for women), hypertension and male gender.  Use of agents associated with hypertension: none.     Weight trend: stable Wt Readings from Last 3 Encounters:  07/12/17 189 lb 6.4 oz (85.9 kg)  07/12/17 189 lb 6.4 oz (85.9 kg)  04/12/17 194 lb (88 kg)    Current diet: well balanced  ------------------------------------------------------------------------  Lipid/Cholesterol, Follow-up:   Last seen for this4 months ago.  Management changes since that visit include none. . Last Lipid Panel:    Component Value Date/Time   CHOL 150 03/18/2017 1134   TRIG 130 03/18/2017 1134   HDL 37 (L) 03/18/2017 1134   CHOLHDL 4.1 03/18/2017 1134   CHOLHDL 5.1 (H) 11/23/2016 1611   LDLCALC 87 03/18/2017 1134   LDLCALC  109 (H) 11/23/2016 1611    Risk factors for vascular disease include hypertension and smoking  He reports excellent compliance with treatment. He is not having side effects.  Current symptoms include none and have been stable. Weight trend: stable Prior visit with dietician: None Current diet: in general, a "healthy" diet   Current exercise: N/A  Wt Readings from Last 3 Encounters:  07/12/17 189 lb 6.4 oz (85.9 kg)  07/12/17 189 lb 6.4 oz (85.9 kg)  04/12/17 194 lb (88 kg)    -------------------------------------------------------------------  -----------------------------------------------------------   Review of Systems  Constitutional: Negative.   HENT: Negative.   Respiratory: Positive for shortness of breath and wheezing.   Cardiovascular: Negative.   Gastrointestinal: Negative for abdominal pain and blood in stool.       Intermittent heart burn and occasional swallowing difficulty with dry food.  Genitourinary: Positive for scrotal swelling.    Social History   Socioeconomic History  . Marital status: Married    Spouse name: Not on file  . Number of children: 3  . Years of education: Not on file  . Highest education level: 9th grade  Occupational History  . Occupation: retired  Engineer, production  . Financial resource strain: Not hard at all  . Food insecurity:    Worry: Never true    Inability: Never true  . Transportation needs:    Medical: No    Non-medical: No  Tobacco Use  . Smoking status: Former Smoker    Types: Cigarettes  . Smokeless  tobacco: Never Used  . Tobacco comment: quit in 2012  Substance and Sexual Activity  . Alcohol use: No    Alcohol/week: 0.0 oz    Comment: stopped in 2012  . Drug use: No  . Sexual activity: Not on file  Lifestyle  . Physical activity:    Days per week: Not on file    Minutes per session: Not on file  . Stress: Not at all  Relationships  . Social connections:    Talks on phone: Not on file    Gets together:  Not on file    Attends religious service: Not on file    Active member of club or organization: Not on file    Attends meetings of clubs or organizations: Not on file    Relationship status: Not on file  . Intimate partner violence:    Fear of current or ex partner: Not on file    Emotionally abused: Not on file    Physically abused: Not on file    Forced sexual activity: Not on file  Other Topics Concern  . Not on file  Social History Narrative  . Not on file   No past medical history on file.   Patient Active Problem List   Diagnosis Date Noted  . Chronic cough 12/03/2015  . Abnormal blood chemistry 06/27/2014  . Adjustment disorder with mixed anxiety and depressed mood 06/27/2014  . Anxiety disorder 06/27/2014  . Chest pain on exertion 06/27/2014  . COPD, mild (HCC) 06/27/2014  . Acid indigestion 06/27/2014  . Can't get food down 06/27/2014  . Breath shortness 06/27/2014  . Herpes zona 06/27/2014  . History of digestive disease 06/27/2014  . Hydrocele 06/27/2014  . HLD (hyperlipidemia) 06/27/2014  . Benign hypertension 06/27/2014  . BP (high blood pressure) 06/27/2014  . 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 Cerumen impaction 06/27/2014  . Pruritic dermatitis 06/27/2014  . Restless leg 06/27/2014  . 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 Unexplained weight loss 06/27/2014  . Inguinal hernia recurrent bilateral 08/28/2008  . 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 Malaise and fatigue 12/17/2006  . Accumulation of fluid in tissues 07/26/2006  . Essential (primary) hypertension 07/26/2006  . Tobacco use 07/26/2006   Past Surgical History:  Procedure Laterality Date  . HERNIA REPAIR Bilateral 08/08/2010   His family history includes Heart disease in his mother.     Current Outpatient Medications:  .  albuterol (VENTOLIN HFA) 108 (90 Base) MCG/ACT inhaler, Inhale 2 puffs into the lungs every 6 (six) hours as needed for wheezing or shortness of breath., Disp: 1 Inhaler, Rfl: 3 .   amLODipine (NORVASC) 5 MG tablet, Take 1 tablet (5 mg total) by mouth daily., Disp: 30 tablet, Rfl: 12 .  B Complex Vitamins (VITAMIN B COMPLEX PO), Take by mouth daily., Disp: , Rfl:  .  clonazePAM (KLONOPIN) 0.5 MG tablet, Take 1 tablet (0.5 mg total) by mouth at bedtime., Disp: 30 tablet, Rfl: 1 .  fenofibrate 160 MG tablet, Take 1 tablet (160 mg total) by mouth daily., Disp: 30 tablet, Rfl: 6 .  Fexofenadine HCl (MUCINEX ALLERGY PO), Take 1 tablet by mouth daily., Disp: , Rfl:  .  Fluticasone-Umeclidin-Vilant (TRELEGY ELLIPTA) 100-62.5-25 MCG/INH AEPB, Inhale 1 puff into the lungs daily., Disp: 1 each, Rfl: 3 .  MEGARED OMEGA-3 KRILL OIL PO, Take 1 tablet by mouth daily., Disp: , Rfl:  .  omeprazole (PRILOSEC) 40 MG capsule, Take 1 capsule by mouth daily., Disp: , Rfl:  .  sertraline (  ZOLOFT) 50 MG tablet, Take 1 tablet (50 mg total) by mouth at bedtime., Disp: 90 tablet, Rfl: 1  Patient Care Team: Chrismon, Jodell Cipro, PA as PCP - General (Physician Assistant) Vernie Murders, MD as Consulting Physician (Otolaryngology)    Objective:   Vitals: BP (!) 148/72   Temp 98.8 F (37.1 C) (Oral)   Wt 189 lb 6.4 oz (85.9 kg)   BMI 27.97 kg/m  BP Readings from Last 3 Encounters:  07/12/17 (!) 148/72  07/12/17 (!) 142/70  04/12/17 138/70    Physical Exam  Constitutional: He is oriented to person, place, and time. He appears well-developed and well-nourished.  HENT:  Head: Normocephalic and atraumatic.  Right Ear: External ear normal.  Left Ear: External ear normal.  Nose: Nose normal.  Mouth/Throat: Oropharynx is clear and moist.  Eyes: Pupils are equal, round, and reactive to light. Conjunctivae and EOM are normal. Right eye exhibits no discharge.  Neck: Normal range of motion. Neck supple. No tracheal deviation present. No thyromegaly present.  Cardiovascular: Normal rate, regular rhythm, normal heart sounds and intact distal pulses.  No murmur heard. Pulmonary/Chest: Effort normal  and breath sounds normal. No respiratory distress. He has no wheezes. He has no rales. He exhibits no tenderness.  Abdominal: Soft. He exhibits no distension and no mass. There is no tenderness. There is no rebound and no guarding.  Genitourinary:  Genitourinary Comments: Right hydrocele. Had bilateral inguinal herniorrhaphy by Dr. Colette Ribas on 08-18-10.  Musculoskeletal: Normal range of motion. He exhibits no edema or tenderness.  Lymphadenopathy:    He has no cervical adenopathy.  Neurological: He is alert and oriented to person, place, and time. He has normal reflexes. He displays normal reflexes. No cranial nerve deficit. He exhibits normal muscle tone. Coordination normal.  Skin: Skin is warm and dry. No rash noted. No erythema.  Psychiatric: He has a normal mood and affect. His behavior is normal. Judgment and thought content normal.   Activities of Daily Living In your present state of health, do you have any difficulty performing the following activities: 07/12/2017 08/17/2016  Hearing? Y Y  Comment Does not wear hearing aids but is interested in getting hearing aids. Sees Dr Elenore Rota.  -  Vision? N Y  Difficulty concentrating or making decisions? N Y  Walking or climbing stairs? N Y  Dressing or bathing? N N  Doing errands, shopping? N N  Preparing Food and eating ? N -  Using the Toilet? N -  In the past six months, have you accidently leaked urine? N -  Do you have problems with loss of bowel control? N -  Managing your Medications? N -  Managing your Finances? N -  Housekeeping or managing your Housekeeping? N -  Some recent data might be hidden    Fall Risk Assessment Fall Risk  07/12/2017 08/17/2016 12/16/2015 12/10/2014  Falls in the past year? No No No No     Depression Screen PHQ 2/9 Scores 07/12/2017 08/17/2016 12/16/2015 12/10/2014  PHQ - 2 Score 0 PHQ- 9 Score - 11 - -   Cognitive Testing - 6-CIT  Correct? Score   What year is it? yes 0 0 or 4  What month is  it? yes 0 0 or 3  Memorize:    [name],  41,  [address],  [place],      What time is it? (within 1 hour) yes 0 0 or 3  Count backwards from 20 yes  0 0, 2, or 4  Name the months of the year no 2 0, 2, or 4  Repeat name & address above no 8 0, 2, 4, 6, 8, or 10       TOTAL SCORE  10/28   Interpretation:  Abnormal- Borderline  Normal (0-7) Abnormal (8-28)   Assessment & Plan:    Annual Physical Reviewed patient's Family Medical History Reviewed and updated list of patient's medical providers Assessment of cognitive impairment was done Assessed patient's functional ability Established a written schedule for health screening services Health Risk Assessent Completed and Reviewed  Exercise Activities and Dietary recommendations Goals    . DIET - INCREASE WATER INTAKE     Recommend to start drinking 3 glasses of water a day.        Immunization History  Administered Date(s) Administered  . Influenza, High Dose Seasonal PF 12/10/2014, 12/16/2015, 11/23/2016  . Pneumococcal Conjugate-13 07/26/2014  . Pneumococcal Polysaccharide-23 12/16/2015  . Tdap 09/05/2012  . Zoster 07/26/2014    Health Maintenance  Topic Date Due  . INFLUENZA VACCINE  09/23/2017  . TETANUS/TDAP  09/06/2022  . PNA vac Low Risk Adult  Completed     Discussed health benefits of physical activity, and encouraged him to engage in regular exercise appropriate for his age and condition.    ------------------------------------------------------------------------------------------------------------ 1. Essential (primary) hypertension Stable BP. Should limit salt intake and caffeine consumption. Recheck CBC, CMP, TSH and Lipid Panel. Follow up pending lab reports. - CBC with Differential/Platelet - Comprehensive metabolic panel - Lipid panel - TSH  2. COPD, mild (HCC) Spirometry on 09-05-12 confirmed severe obstructive disease with past history of heavy smoking. Repeat on 08-17-16 showed moderately severe  restrictive disease Recheck CBC. Pulse oximetry 92% and should continue Breo daily with Ventolin prn. - CBC with Differential/Platelet  3. Esophagogastric ring Dyspepsia recurrent and some problems swallowing large bolus of dry food. History of high grade Schatzki's Ring with hiatal hernia on upper endoscopy on 05-16-12.  Using Omeprazole 40 mg daily  4. Mixed hyperlipidemia Tolerating Fenofibrate 160 mg qd without side effects. Will recheck CMP, TSH and CMP. - Comprehensive metabolic panel - Lipid panel - TSH  5. Adjustment disorder with mixed anxiety and depressed mood Anxious and jittery. Worse with concern about wife's failing health and susceptibility to falling. Recheck labs. Still feels the Clonazepam 0.5 mg HS and Sertraline 50 mg HS controls symptoms well. - CBC with Differential/Platelet - Lipid panel    Dortha Kern, PA  [facility]

## 2017-07-12 NOTE — Progress Notes (Addendum)
Subjective:   Andrew Hall is a 77 y.o. male who presents for Medicare Annual/Subsequent preventive examination.  Review of Systems:  N/A   Cardiac Risk Factors include: advanced age (>15men, >64 women);dyslipidemia;hypertension;male gender     Objective:    Vitals: BP (!) 142/70 (BP Location: Right Arm)   Pulse 70   Temp 98.8 F (37.1 C) (Oral)   Ht  (1.753 m)   Wt 189 lb 6.4 oz (85.9 kg)   SpO2 92%   BMI 27.97 kg/m   Body mass index is 27.97 kg/m.  Advanced Directives 07/12/2017 08/20/2014  Does Patient Have a Medical Advance Directive? No No  Would patient like information on creating a medical advance directive? No - Patient declined No - patient declined information    Tobacco Social History   Tobacco Use  Smoking Status Former Smoker  . Types: Cigarettes  Smokeless Tobacco Never Used  Tobacco Comment   quit in 2012     Counseling given: Not Answered Comment: quit in 2012   Clinical Intake:  Pre-visit preparation completed: Yes  Pain : No/denies pain Pain Score: 0-No pain     Nutritional Status: BMI 25 -29 Overweight Nutritional Risks: None Diabetes: No  How often do you need to have someone help you when you read instructions, pamphlets, or other written materials from your doctor or pharmacy?: 1 - Never  Interpreter Needed?: No  Information entered by :: [facility], LPN  History reviewed. No pertinent past medical history. Past Surgical History:  Procedure Laterality Date  . HERNIA REPAIR Bilateral 08/08/2010   Family History  Problem Relation Age of Onset  . Heart disease Mother    Social History   Socioeconomic History  . Marital status: Married    Spouse name: Not on file  . Number of children: 3  . Years of education: Not on file  . Highest education level: 9th grade  Occupational History  . Occupation: retired  Engineer, production  . Financial resource strain: Not hard at all  . Food insecurity:    Worry: Never true   Inability: Never true  . Transportation needs:    Medical: No    Non-medical: No  Tobacco Use  . Smoking status: Former Smoker    Types: Cigarettes  . Smokeless tobacco: Never Used  . Tobacco comment: quit in 2012  Substance and Sexual Activity  . Alcohol use: No    Alcohol/week: 0.0 oz    Comment: stopped in 2012  . Drug use: No  . Sexual activity: Not on file  Lifestyle  . Physical activity:    Days per week: Not on file    Minutes per session: Not on file  . Stress: Not at all  Relationships  . Social connections:    Talks on phone: Not on file    Gets together: Not on file    Attends religious service: Not on file    Active member of club or organization: Not on file    Attends meetings of clubs or organizations: Not on file    Relationship status: Not on file  Other Topics Concern  . Not on file  Social History Narrative  . Not on file    Outpatient Encounter Medications as of 07/12/2017  Medication Sig  . albuterol (VENTOLIN HFA) 108 (90 Base) MCG/ACT inhaler Inhale 2 puffs into the lungs every 6 (six) hours as needed for wheezing or shortness of breath.  Marland Kitchen amLODipine (NORVASC) 5 MG tablet Take 1 tablet (  5 mg total) by mouth daily.  . B Complex Vitamins (VITAMIN B COMPLEX PO) Take by mouth daily.  . clonazePAM (KLONOPIN) 0.5 MG tablet Take 1 tablet (0.5 mg total) by mouth at bedtime.  . fenofibrate 160 MG tablet Take 1 tablet (160 mg total) by mouth daily.  Marland Kitchen Fexofenadine HCl (MUCINEX ALLERGY PO) Take 1 tablet by mouth daily.  . Fluticasone-Umeclidin-Vilant (TRELEGY ELLIPTA) 100-62.5-25 MCG/INH AEPB Inhale 1 puff into the lungs daily.  Marland Kitchen MEGARED OMEGA-3 KRILL OIL PO Take 1 tablet by mouth daily.  Marland Kitchen omeprazole (PRILOSEC) 40 MG capsule Take 1 capsule by mouth daily.  . sertraline (ZOLOFT) 50 MG tablet Take 1 tablet (50 mg total) by mouth at bedtime.  . [DISCONTINUED] levofloxacin (LEVAQUIN) 500 MG tablet Take 1 tablet (500 mg total) by mouth daily.  . [DISCONTINUED]  Omega 3 1000 MG CAPS Take 1 capsule by mouth daily.   No facility-administered encounter medications on file as of 07/12/2017.     Activities of Daily Living In your present state of health, do you have any difficulty performing the following activities: 07/12/2017 08/17/2016  Hearing? Y Y  Comment Does not wear hearing aids but is interested in getting hearing aids. Sees Dr Elenore Rota.  -  Vision? N Y  Difficulty concentrating or making decisions? N Y  Walking or climbing stairs? N Y  Dressing or bathing? N N  Doing errands, shopping? N N  Preparing Food and eating ? N -  Using the Toilet? N -  In the past six months, have you accidently leaked urine? N -  Do you have problems with loss of bowel control? N -  Managing your Medications? N -  Managing your Finances? N -  Housekeeping or managing your Housekeeping? N -  Some recent data might be hidden    Patient Care Team: Chrismon, Jodell Cipro, PA as PCP - General (Physician Assistant) Vernie Murders, MD as Consulting Physician (Otolaryngology)   Assessment:   This is a routine wellness examination for [facility].  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 DIET - INCREASE WATER INTAKE     Recommend to start drinking 3 glasses of water a day.        Fall Risk Fall Risk  07/12/2017 08/17/2016 12/16/2015 12/10/2014  Falls in the past year? No No No No   Is the patient's home free of loose throw rugs in walkways, pet beds, electrical cords, etc?   yes      Grab bars in the bathroom? yes      Handrails on the stairs?   no      Adequate lighting?   yes  Timed Get Up and Go Performed: N/A  Depression Screen PHQ 2/9 Scores 07/12/2017 08/17/2016 12/16/2015 12/10/2014  PHQ - 2 Score 0 PHQ- 9 Score - 11 - -    Cognitive Function       6CIT Screen 07/12/2017  What Year? 0 points  What month? 0 points  What time? 0 points  Count back from 20 0 points  Months in reverse 2 points  Repeat phrase 8 points  Total Score 10    Immunization History  Administered Date(s) Administered  . Influenza, High Dose Seasonal PF 12/10/2014, 12/16/2015, 11/23/2016  . Pneumococcal Conjugate-13 07/26/2014  . Pneumococcal Polysaccharide-23  12/16/2015  . Tdap 09/05/2012  . Zoster 07/26/2014    Qualifies for Shingles Vaccine? Due for Shingles vaccine. Declined my offer to administer today. Education has been provided regarding the importance of this vaccine. Pt has been advised to call her insurance company to determine her out of pocket expense. Advised she may also receive this vaccine at her local pharmacy or Health Dept. Verbalized acceptance and understanding.  Screening Tests Health Maintenance  Topic Date Due  . INFLUENZA VACCINE  09/23/2017  . TETANUS/TDAP  09/06/2022  . PNA vac Low Risk Adult  Completed   Cancer Screenings: Lung: Low Dose CT Chest recommended if Age 37-80 years, 30 pack-year currently smoking OR have quit w/in 15years. Patient does not qualify. Colorectal: Up to date  Additional Screenings:  Hepatitis C Screening: N/A      Plan:  I have personally reviewed and addressed the Medicare Annual Wellness questionnaire and have noted the following in the patient's chart:  A. Medical and social history B. Use of alcohol, tobacco or illicit drugs  C. Current medications and supplements D. Functional ability and status E.  Nutritional status F.  Physical activity G. Advance directives H. List of other physicians I.  Hospitalizations, surgeries, and ER visits in previous 12 months J.  Vitals K. Screenings such as hearing and vision if needed, cognitive and depression L. Referrals and appointments - none  In addition, I have reviewed and discussed with patient certain preventive protocols, quality  metrics, and best practice recommendations. A written personalized care plan for preventive services as well as general preventive health recommendations were provided to patient.  See attached scanned questionnaire for additional information.   Signed,  Hyacinth Meeker, LPN Nurse Health Advisor   Nurse Recommendations: None.  Reviewed the documentation and recommendations of the Nurse Health Advisor. Was available for consultation during this screening. Agree with this note and plan.

## 2017-07-12 NOTE — Patient Instructions (Addendum)
Andrew Hall , Thank you for taking time to come for your Medicare Wellness Visit. I appreciate your ongoing commitment to your health goals. Please review the following plan we discussed and let me know if I can assist you in the future.   Screening recommendations/referrals: Colonoscopy: Up to date Recommended yearly ophthalmology/optometry visit for glaucoma screening and checkup Recommended yearly dental visit for hygiene and checkup  Vaccinations: Influenza vaccine: Up to date Pneumococcal vaccine: Up to date Tdap vaccine: Up to date Shingles vaccine: Pt declines today.     Advanced directives: Advance directive discussed with you today. Even though you declined this today please call our office should you change your mind and we can give you the proper paperwork for you to fill out.  Conditions/risks identified: Recommend to start drinking 3 glasses of water a day.   Next appointment: 3:00 PM today with Andrew Hall.   Preventive Care 67 Years and Older, Male Preventive care refers to lifestyle choices and visits with your health care provider that can promote health and wellness. What does preventive care include?  A yearly physical exam. This is also called an annual well check.  Dental exams once or twice a year.  Routine eye exams. Ask your health care provider how often you should have your eyes checked.  Personal lifestyle choices, including:  Daily care of your teeth and gums.  Regular physical activity.  Eating a healthy diet.  Avoiding tobacco and drug use.  Limiting alcohol use.  Practicing safe sex.  Taking low doses of aspirin every day.  Taking vitamin and mineral supplements as recommended by your health care provider. What happens during an annual well check? The services and screenings done by your health care provider during your annual well check will depend on your age, overall health, lifestyle risk factors, and family history of disease. Counseling    Your health care provider may ask you questions about your:  Alcohol use.  Tobacco use.  Drug use.  Emotional well-being.  Home and relationship well-being.  Sexual activity.  Eating habits.  History of falls.  Memory and ability to understand (cognition).  Work and work Astronomer. Screening  You may have the following tests or measurements:  Height, weight, and BMI.  Blood pressure.  Lipid and cholesterol levels. These may be checked every 5 years, or more frequently if you are over 14 years old.  Skin check.  Lung cancer screening. You may have this screening every year starting at age 103 if you have a 30-pack-year history of smoking and currently smoke or have quit within the past 15 years.  Fecal occult blood test (FOBT) of the stool. You may have this test every year starting at age 15.  Flexible sigmoidoscopy or colonoscopy. You may have a sigmoidoscopy every 5 years or a colonoscopy every 10 years starting at age 25.  Prostate cancer screening. Recommendations will vary depending on your family history and other risks.  Hepatitis C blood test.  Hepatitis B blood test.  Sexually transmitted disease (STD) testing.  Diabetes screening. This is done by checking your blood sugar (glucose) after you have not eaten for a while (fasting). You may have this done every 1-3 years.  Abdominal aortic aneurysm (AAA) screening. You may need this if you are a current or former smoker.  Osteoporosis. You may be screened starting at age 13 if you are at high risk. Talk with your health care provider about your test results, treatment options, and if necessary, the  need for more tests. Vaccines  Your health care provider may recommend certain vaccines, such as:  Influenza vaccine. This is recommended every year.  Tetanus, diphtheria, and acellular pertussis (Tdap, Td) vaccine. You may need a Td booster every 10 years.  Zoster vaccine. You may need this after age  25.  Pneumococcal 13-valent conjugate (PCV13) vaccine. One dose is recommended after age 61.  Pneumococcal polysaccharide (PPSV23) vaccine. One dose is recommended after age 7. Talk to your health care provider about which screenings and vaccines you need and how often you need them. This information is not intended to replace advice given to you by your health care provider. Make sure you discuss any questions you have with your health care provider. Document Released: 03/08/2015 Document Revised: 10/30/2015 Document Reviewed: 12/11/2014 Elsevier Interactive Patient Education  2017 [employer] Prevention in the Home Falls can cause injuries. They can happen to people of all ages. There are many things you can do to make your home safe and to help prevent falls. What can I do on the outside of my home?  Regularly fix the edges of walkways and driveways and fix any cracks.  Remove anything that might make you trip as you walk through a door, such as a raised step or threshold.  Trim any bushes or trees on the path to your home.  Use bright outdoor lighting.  Clear any walking paths of anything that might make someone trip, such as rocks or tools.  Regularly check to see if handrails are loose or broken. Make sure that both sides of any steps have handrails.  Any raised decks and porches should have guardrails on the edges.  Have any leaves, snow, or ice cleared regularly.  Use sand or salt on walking paths during winter.  Clean up any spills in your garage right away. This includes oil or grease spills. What can I do in the bathroom?  Use night lights.  Install grab bars by the toilet and in the tub and shower. Do not use towel bars as grab bars.  Use non-skid mats or decals in the tub or shower.  If you need to sit down in the shower, use a plastic, non-slip stool.  Keep the floor dry. Clean up any water that spills on the floor as soon as it happens.  Remove  soap buildup in the tub or shower regularly.  Attach bath mats securely with double-sided non-slip rug tape.  Do not have throw rugs and other things on the floor that can make you trip. What can I do in the bedroom?  Use night lights.  Make sure that you have a light by your bed that is easy to reach.  Do not use any sheets or blankets that are too big for your bed. They should not hang down onto the floor.  Have a firm chair that has side arms. You can use this for support while you get dressed.  Do not have throw rugs and other things on the floor that can make you trip. What can I do in the kitchen?  Clean up any spills right away.  Avoid walking on wet floors.  Keep items that you use a lot in easy-to-reach places.  If you need to reach something above you, use a strong step stool that has a grab bar.  Keep electrical cords out of the way.  Do not use floor polish or wax that makes floors slippery. If you must use wax,  use non-skid floor wax.  Do not have throw rugs and other things on the floor that can make you trip. What can I do with my stairs?  Do not leave any items on the stairs.  Make sure that there are handrails on both sides of the stairs and use them. Fix handrails that are broken or loose. Make sure that handrails are as long as the stairways.  Check any carpeting to make sure that it is firmly attached to the stairs. Fix any carpet that is loose or worn.  Avoid having throw rugs at the top or bottom of the stairs. If you do have throw rugs, attach them to the floor with carpet tape.  Make sure that you have a light switch at the top of the stairs and the bottom of the stairs. If you do not have them, ask someone to add them for you. What else can I do to help prevent falls?  Wear shoes that:  Do not have high heels.  Have rubber bottoms.  Are comfortable and fit you well.  Are closed at the toe. Do not wear sandals.  If you use a  stepladder:  Make sure that it is fully opened. Do not climb a closed stepladder.  Make sure that both sides of the stepladder are locked into place.  Ask someone to hold it for you, if possible.  Clearly mark and make sure that you can see:  Any grab bars or handrails.  First and last steps.  Where the edge of each step is.  Use tools that help you move around (mobility aids) if they are needed. These include:  Canes.  Walkers.  Scooters.  Crutches.  Turn on the lights when you go into a dark area. Replace any light bulbs as soon as they burn out.  Set up your furniture so you have a clear path. Avoid moving your furniture around.  If any of your floors are uneven, fix them.  If there are any pets around you, be aware of where they are.  Review your medicines with your doctor. Some medicines can make you feel dizzy. This can increase your chance of falling. Ask your doctor what other things that you can do to help prevent falls. This information is not intended to replace advice given to you by your health care provider. Make sure you discuss any questions you have with your health care provider. Document Released: 12/06/2008 Document Revised: 07/18/2015 Document Reviewed: 03/16/2014 Elsevier Interactive Patient Education  2017 [employer].

## 2017-07-12 NOTE — Progress Notes (Deleted)
Name: Andrew Hall   MRN: [id number]    DOB: 13-Jan-1941   Date:07/12/2017       Progress Note  Subjective  Chief Complaint  No chief complaint on file.   HPI  *** No problem-specific Assessment & Plan notes found for this encounter.   No past medical history on file.  Social History   Tobacco Use  . Smoking status: Former Smoker    Types: Cigarettes  . Smokeless tobacco: Never Used  . Tobacco comment: quit in 2012  Substance Use Topics  . Alcohol use: No    Alcohol/week: 0.0 oz    Comment: stopped in 2012     Current Outpatient Medications:  .  albuterol (VENTOLIN HFA) 108 (90 Base) MCG/ACT inhaler, Inhale 2 puffs into the lungs every 6 (six) hours as needed for wheezing or shortness of breath., Disp: 1 Inhaler, Rfl: 3 .  amLODipine (NORVASC) 5 MG tablet, Take 1 tablet (5 mg total) by mouth daily., Disp: 30 tablet, Rfl: 12 .  B Complex Vitamins (VITAMIN B COMPLEX PO), Take by mouth daily., Disp: , Rfl:  .  clonazePAM (KLONOPIN) 0.5 MG tablet, Take 1 tablet (0.5 mg total) by mouth at bedtime., Disp: 30 tablet, Rfl: 1 .  fenofibrate 160 MG tablet, Take 1 tablet (160 mg total) by mouth daily., Disp: 30 tablet, Rfl: 6 .  Fexofenadine HCl (MUCINEX ALLERGY PO), Take 1 tablet by mouth daily., Disp: , Rfl:  .  Fluticasone-Umeclidin-Vilant (TRELEGY ELLIPTA) 100-62.5-25 MCG/INH AEPB, Inhale 1 puff into the lungs daily., Disp: 1 each, Rfl: 3 .  MEGARED OMEGA-3 KRILL OIL PO, Take 1 tablet by mouth daily., Disp: , Rfl:  .  omeprazole (PRILOSEC) 40 MG capsule, Take 1 capsule by mouth daily., Disp: , Rfl:  .  sertraline (ZOLOFT) 50 MG tablet, Take 1 tablet (50 mg total) by mouth at bedtime., Disp: 90 tablet, Rfl: 1  No Known Allergies  ROS  ***  Objective  There were no vitals filed for this visit.   Physical Exam  ***  No results found for this or any previous visit (from the past 2160 hour(s)).   Assessment & Plan  There are no diagnoses linked to this  encounter.

## 2017-07-13 ENCOUNTER — Telehealth: Payer: Self-pay

## 2017-07-13 LAB — CBC WITH DIFFERENTIAL/PLATELET
BASOS: 1 %
Basophils Absolute: 0.1 10*3/uL (ref 0.0–0.2)
EOS (ABSOLUTE): 0.3 10*3/uL (ref 0.0–0.4)
EOS: 3 %
HEMATOCRIT: 48.1 % (ref 37.5–51.0)
Hemoglobin: 16 g/dL (ref 13.0–17.7)
Immature Grans (Abs): 0 10*3/uL (ref 0.0–0.1)
Immature Granulocytes: 0 %
LYMPHS ABS: 1.6 10*3/uL (ref 0.7–3.1)
Lymphs: 16 %
MCH: 31.2 pg (ref 26.6–33.0)
MCHC: 33.3 g/dL (ref 31.5–35.7)
MCV: 94 fL (ref 79–97)
MONOS ABS: 1 10*3/uL — AB (ref 0.1–0.9)
Monocytes: 10 %
Neutrophils Absolute: 7.1 10*3/uL — ABNORMAL HIGH (ref 1.4–7.0)
Neutrophils: 70 %
Platelets: 364 10*3/uL (ref 150–450)
RBC: 5.13 x10E6/uL (ref 4.14–5.80)
RDW: 15.5 % — AB (ref 12.3–15.4)
WBC: 10.1 10*3/uL (ref 3.4–10.8)

## 2017-07-13 LAB — COMPREHENSIVE METABOLIC PANEL
A/G RATIO: 1.5 (ref 1.2–2.2)
ALK PHOS: 75 IU/L (ref 39–117)
ALT: 14 IU/L (ref 0–44)
AST: 22 IU/L (ref 0–40)
Albumin: 4.4 g/dL (ref 3.5–4.8)
BUN/Creatinine Ratio: 12 (ref 10–24)
BUN: 13 mg/dL (ref 8–27)
Bilirubin Total: 0.5 mg/dL (ref 0.0–1.2)
CO2: 27 mmol/L (ref 20–29)
Calcium: 9.3 mg/dL (ref 8.6–10.2)
Chloride: 98 mmol/L (ref 96–106)
Creatinine, Ser: 1.08 mg/dL (ref 0.76–1.27)
GFR calc Af Amer: 76 mL/min/{1.73_m2} (ref >59)
GFR calc non Af Amer: 66 mL/min/{1.73_m2} (ref >59)
GLOBULIN, TOTAL: 3 g/dL (ref 1.5–4.5)
Glucose: 93 mg/dL (ref 65–99)
POTASSIUM: 4.7 mmol/L (ref 3.5–5.2)
Sodium: 141 mmol/L (ref 134–144)
Total Protein: 7.4 g/dL (ref 6.0–8.5)

## 2017-07-13 LAB — LIPID PANEL
CHOL/HDL RATIO: 5.6 ratio — AB (ref 0.0–5.0)
CHOLESTEROL TOTAL: 185 mg/dL (ref 100–199)
HDL: 33 mg/dL — ABNORMAL LOW (ref >39)
LDL CALC: 105 mg/dL — AB (ref 0–99)
Triglycerides: 235 mg/dL — ABNORMAL HIGH (ref 0–149)
VLDL Cholesterol Cal: 47 mg/dL — ABNORMAL HIGH (ref 5–40)

## 2017-07-13 LAB — TSH: TSH: 3.39 u[IU]/mL (ref 0.450–4.500)

## 2017-07-13 NOTE — Telephone Encounter (Signed)
-----   Message from Tamsen Roers, [place] sent at 07/13/2017  8:20 AM EDT ----- All tests normal except cholesterol and triglycerides going off track again. Continue Fenofibrate 160 mg qd with MegaRed Krill Oil qd and lower fats in diet. Recheck levels in 3 months.

## 2017-07-13 NOTE — Telephone Encounter (Signed)
Pt returned call ° °teri °

## 2017-07-13 NOTE — Telephone Encounter (Signed)
LMTCB

## 2017-07-14 NOTE — Telephone Encounter (Signed)
Patient advised. He states he will call back to schedule a follow up appointment.  °

## 2017-07-26 ENCOUNTER — Telehealth: Payer: Self-pay | Admitting: Family Medicine

## 2017-07-26 ENCOUNTER — Other Ambulatory Visit: Payer: Self-pay | Admitting: Family Medicine

## 2017-07-26 DIAGNOSIS — F419 Anxiety disorder, unspecified: Secondary | ICD-10-CM

## 2017-07-26 MED ORDER — SERTRALINE HCL 50 MG PO TABS
50.0000 mg | ORAL_TABLET | Freq: Every day | ORAL | 1 refills | Status: DC
Start: 2017-07-26 — End: 2017-07-27

## 2017-07-26 MED ORDER — CLONAZEPAM 0.5 MG PO TABS: 0.5000 mg | ORAL_TABLET | Freq: Every day | ORAL | 1 refills | Status: DC

## 2017-07-26 NOTE — Telephone Encounter (Signed)
Done

## 2017-07-26 NOTE — Telephone Encounter (Signed)
Patient needs refills on Klonipen and Sertraline 50 mg. Sent to [facility] on [address]

## 2017-07-27 ENCOUNTER — Other Ambulatory Visit: Payer: Self-pay | Admitting: Family Medicine

## 2017-07-27 DIAGNOSIS — F419 Anxiety disorder, unspecified: Secondary | ICD-10-CM

## 2017-07-27 NOTE — Telephone Encounter (Signed)
Patient's daughter states [facility] did not received RX for Sertraline. It appears class was set not set to normal. Sent RX to pharmacy.

## 2017-08-02 ENCOUNTER — Encounter: Payer: Self-pay | Admitting: Family Medicine

## 2017-08-23 ENCOUNTER — Other Ambulatory Visit: Payer: Self-pay | Admitting: Family Medicine

## 2017-08-23 ENCOUNTER — Telehealth: Payer: Self-pay | Admitting: Family Medicine

## 2017-08-23 DIAGNOSIS — E782 Mixed hyperlipidemia: Secondary | ICD-10-CM

## 2017-08-23 MED ORDER — FENOFIBRATE 160 MG PO TABS: 160.0000 mg | ORAL_TABLET | Freq: Every day | ORAL | 11 refills | Status: DC

## 2017-08-23 NOTE — Telephone Encounter (Signed)
Done

## 2017-08-23 NOTE — Telephone Encounter (Signed)
Patient needs refill on Fenofibrate 160 mg. Called to [facility] on [address]

## 2017-09-23 ENCOUNTER — Other Ambulatory Visit: Payer: Self-pay | Admitting: Family Medicine

## 2017-09-23 DIAGNOSIS — F419 Anxiety disorder, unspecified: Secondary | ICD-10-CM

## 2017-09-23 NOTE — Telephone Encounter (Signed)
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 He needs refills on Clonazepam please.  [facility] [address]

## 2017-09-24 MED ORDER — CLONAZEPAM 0.5 MG PO TABS: 0.5000 mg | ORAL_TABLET | Freq: Every day | ORAL | 3 refills | Status: DC

## 2017-11-01 ENCOUNTER — Encounter: Payer: Self-pay | Admitting: Family Medicine

## 2017-11-01 ENCOUNTER — Ambulatory Visit: Payer: Medicare PPO | Admitting: Family Medicine

## 2017-11-01 VITALS — BP 144/76 | HR 75 | Temp 97.4°F | Wt 194.0 lb

## 2017-11-01 DIAGNOSIS — J449 Chronic obstructive pulmonary disease, unspecified: Secondary | ICD-10-CM | POA: Diagnosis not present

## 2017-11-01 DIAGNOSIS — I10 Essential (primary) hypertension: Secondary | ICD-10-CM

## 2017-11-01 DIAGNOSIS — Z23 Encounter for immunization: Secondary | ICD-10-CM | POA: Diagnosis not present

## 2017-11-01 DIAGNOSIS — F419 Anxiety disorder, unspecified: Secondary | ICD-10-CM | POA: Diagnosis not present

## 2017-11-01 DIAGNOSIS — E782 Mixed hyperlipidemia: Secondary | ICD-10-CM

## 2017-11-01 DIAGNOSIS — R600 Localized edema: Secondary | ICD-10-CM

## 2017-11-01 NOTE — Addendum Note (Signed)
Addended by: Kavin Leech E on: 11/01/2017 04:44 PM   Modules accepted: Orders
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
## 2017-11-05 DIAGNOSIS — J449 Chronic obstructive pulmonary disease, unspecified: Secondary | ICD-10-CM | POA: Diagnosis not present

## 2017-11-05 DIAGNOSIS — F419 Anxiety disorder, unspecified: Secondary | ICD-10-CM | POA: Diagnosis not present

## 2017-11-05 DIAGNOSIS — R600 Localized edema: Secondary | ICD-10-CM | POA: Diagnosis not present

## 2017-11-05 DIAGNOSIS — E782 Mixed hyperlipidemia: Secondary | ICD-10-CM | POA: Diagnosis not present

## 2017-11-05 DIAGNOSIS — I10 Essential (primary) hypertension: Secondary | ICD-10-CM | POA: Diagnosis not present

## 2017-11-06 LAB — CBC WITH DIFFERENTIAL/PLATELET
BASOS: 1 %
Basophils Absolute: 0.1 10*3/uL (ref 0.0–0.2)
EOS (ABSOLUTE): 0.4 10*3/uL (ref 0.0–0.4)
EOS: 5 %
HEMATOCRIT: 47.2 % (ref 37.5–51.0)
HEMOGLOBIN: 15.2 g/dL (ref 13.0–17.7)
IMMATURE GRANULOCYTES: 0 %
Immature Grans (Abs): 0 10*3/uL (ref 0.0–0.1)
LYMPHS ABS: 2 10*3/uL (ref 0.7–3.1)
Lymphs: 25 %
MCH: 29.9 pg (ref 26.6–33.0)
MCHC: 32.2 g/dL (ref 31.5–35.7)
MCV: 93 fL (ref 79–97)
MONOCYTES: 9 %
MONOS ABS: 0.7 10*3/uL (ref 0.1–0.9)
NEUTROS PCT: 60 %
Neutrophils Absolute: 4.8 10*3/uL (ref 1.4–7.0)
Platelets: 376 10*3/uL (ref 150–450)
RBC: 5.09 x10E6/uL (ref 4.14–5.80)
RDW: 13 % (ref 12.3–15.4)
WBC: 8 10*3/uL (ref 3.4–10.8)

## 2017-11-06 LAB — COMPREHENSIVE METABOLIC PANEL
A/G RATIO: 1.5 (ref 1.2–2.2)
ALT: 10 IU/L (ref 0–44)
AST: 18 IU/L (ref 0–40)
Albumin: 4.1 g/dL (ref 3.5–4.8)
Alkaline Phosphatase: 62 IU/L (ref 39–117)
BILIRUBIN TOTAL: 0.7 mg/dL (ref 0.0–1.2)
BUN/Creatinine Ratio: 15 (ref 10–24)
BUN: 18 mg/dL (ref 8–27)
CALCIUM: 9.4 mg/dL (ref 8.6–10.2)
CHLORIDE: 100 mmol/L (ref 96–106)
CO2: 27 mmol/L (ref 20–29)
Creatinine, Ser: 1.24 mg/dL (ref 0.76–1.27)
GFR calc non Af Amer: 56 mL/min/{1.73_m2} — ABNORMAL LOW (ref >59)
GFR, EST AFRICAN AMERICAN: 64 mL/min/{1.73_m2} (ref >59)
Globulin, Total: 2.8 g/dL (ref 1.5–4.5)
Glucose: 98 mg/dL (ref 65–99)
POTASSIUM: 5 mmol/L (ref 3.5–5.2)
Sodium: 141 mmol/L (ref 134–144)
Total Protein: 6.9 g/dL (ref 6.0–8.5)

## 2017-11-06 LAB — LIPID PANEL
Chol/HDL Ratio: 4.7 ratio (ref 0.0–5.0)
Cholesterol, Total: 169 mg/dL (ref 100–199)
HDL: 36 mg/dL — ABNORMAL LOW (ref >39)
LDL Calculated: 102 mg/dL — ABNORMAL HIGH (ref 0–99)
Triglycerides: 153 mg/dL — ABNORMAL HIGH (ref 0–149)
VLDL Cholesterol Cal: 31 mg/dL (ref 5–40)

## 2017-11-06 LAB — TSH: TSH: 2.22 u[IU]/mL (ref 0.450–4.500)

## 2017-11-08 ENCOUNTER — Telehealth: Payer: Self-pay

## 2017-11-08 NOTE — Telephone Encounter (Signed)
-----   Message from Tamsen Roersennis E Chrism[place], [place] sent at 11/07/2017  7:12 PM EDT ----- Cholesterol a little better than 3 months ago. Continue present medications and low fat diet (cut out the bowl of ice cream at night). Recheck progress in 3-4 months.

## 2017-11-08 NOTE — Telephone Encounter (Signed)
Patient advised as below. Patient verbalizes understanding and is in agreement with treatment plan.  

## 2018-01-24 ENCOUNTER — Other Ambulatory Visit: Payer: Self-pay | Admitting: Family Medicine

## 2018-01-24 ENCOUNTER — Telehealth: Payer: Self-pay | Admitting: Family Medicine

## 2018-01-24 DIAGNOSIS — F419 Anxiety disorder, unspecified: Secondary | ICD-10-CM

## 2018-01-24 MED ORDER — SERTRALINE HCL 50 MG PO TABS: 50.0000 mg | ORAL_TABLET | Freq: Every day | ORAL | 1 refills | Status: DC

## 2018-01-24 MED ORDER — CLONAZEPAM 0.5 MG PO TABS: 0.5000 mg | ORAL_TABLET | Freq: Every day | ORAL | 3 refills | Status: DC

## 2018-01-24 NOTE — Telephone Encounter (Signed)
Patient needs refills on Clonazepam 0.5 mg. And Sertraline 50 mg.

## 2018-01-24 NOTE — Telephone Encounter (Signed)
Done. Schedule follow up with labs in a month.

## 2018-03-02 ENCOUNTER — Telehealth: Payer: Self-pay

## 2018-03-02 NOTE — Telephone Encounter (Signed)
Pt would like samples of Trelegy.  Please advise.  Thanks,   -Vernona Rieger

## 2018-03-03 NOTE — Telephone Encounter (Signed)
None here. Can send refill to his pharmacy, if he wishes.

## 2018-03-07 ENCOUNTER — Encounter: Payer: Self-pay | Admitting: Family Medicine

## 2018-03-07 ENCOUNTER — Ambulatory Visit: Payer: Medicare PPO | Admitting: Family Medicine

## 2018-03-07 VITALS — BP 122/80 | HR 83 | Temp 97.6°F | Resp 16 | Wt 200.0 lb

## 2018-03-07 DIAGNOSIS — J449 Chronic obstructive pulmonary disease, unspecified: Secondary | ICD-10-CM

## 2018-03-07 DIAGNOSIS — E782 Mixed hyperlipidemia: Secondary | ICD-10-CM | POA: Diagnosis not present

## 2018-03-07 DIAGNOSIS — F419 Anxiety disorder, unspecified: Secondary | ICD-10-CM | POA: Diagnosis not present

## 2018-03-07 DIAGNOSIS — I10 Essential (primary) hypertension: Secondary | ICD-10-CM | POA: Diagnosis not present

## 2018-03-07 NOTE — Progress Notes (Signed)
Patient: Andrew Hall Male    DOB: Jun 19, 1940   78 y.o.   MRN: [id number] Visit Date: 03/07/2018  Today's Provider: Dortha Kern, PA   Chief Complaint  Patient presents with  . Follow-up    Hyperlipidemia   Subjective:     HPI   Lipid/Cholesterol, Follow-up:   Last seen for this4 months ago.  Management changes since that visit include continue current medication Fenofibrate stop the late "large bowl of ice cream with cake" each night. . Last Lipid Panel:    Component Value Date/Time   CHOL 169 11/05/2017 1144   TRIG 153 (H) 11/05/2017 1144   HDL 36 (L) 11/05/2017 1144   CHOLHDL 4.7 11/05/2017 1144   CHOLHDL 5.1 (H) 11/23/2016 1611   LDLCALC 102 (H) 11/05/2017 1144   LDLCALC 109 (H) 11/23/2016 1611    Risk factors for vascular disease include hypercholesterolemia and hypertension  He reports excellent compliance with treatment. He is not having side effects.  Current symptoms include none and have been stable. Weight trend: stable  Wt Readings from Last 3 Encounters:  03/07/18 200 lb (90.7 kg)  11/01/17 194 lb (88 kg)  07/12/17 189 lb 6.4 oz (85.9 kg)    -------------------------------------------------------------------  Anxiety, Follow up:  The patient was last seen for this 4 months ago. Changes made since that visit include none. Continue Sertraline 50 mg and Clonazepam 0.5 mg hs.  He reports excellent compliance with treatment. He is not having side effects.  Symptoms are stable and well controlled. ------------------------------------------------------------------------ History reviewed. No pertinent past medical history. Patient Active Problem List   Diagnosis Date Noted  . Chronic cough 12/03/2015  . Abnormal blood chemistry 06/27/2014  . Adjustment disorder with mixed anxiety and depressed mood 06/27/2014  . Anxiety disorder 06/27/2014  . Chest pain on exertion 06/27/2014  . COPD, mild (HCC) 06/27/2014  . Acid indigestion  06/27/2014  . Can't get food down 06/27/2014  . Breath shortness 06/27/2014  . Herpes zona 06/27/2014  . History of digestive disease 06/27/2014  . Hydrocele 06/27/2014  . HLD (hyperlipidemia) 06/27/2014  . Benign hypertension 06/27/2014  . BP (high blood pressure) 06/27/2014  . 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 Cerumen impaction 06/27/2014  . Pruritic dermatitis 06/27/2014  . Restless leg 06/27/2014  . 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 Unexplained weight loss 06/27/2014  . Inguinal hernia recurrent bilateral 08/28/2008  . 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 Malaise and fatigue 12/17/2006  . Accumulation of fluid in tissues 07/26/2006  . Essential (primary) hypertension 07/26/2006  . Tobacco use 07/26/2006   Past Surgical History:  Procedure Laterality Date  . HERNIA REPAIR Bilateral 08/08/2010   Family History  Problem Relation Age of Onset  . Heart disease Mother    No Known Allergies  Current Outpatient Medications:  .  albuterol (VENTOLIN HFA) 108 (90 Base) MCG/ACT inhaler, Inhale 2 puffs into the lungs every 6 (six) hours as needed for wheezing or shortness of breath., Disp: 1 Inhaler, Rfl: 3 .  amLODipine (NORVASC) 5 MG tablet, Take 1 tablet (5 mg total) by mouth daily., Disp: 30 tablet, Rfl: 12 .  B Complex Vitamins (VITAMIN B COMPLEX PO), Take by mouth daily., Disp: , Rfl:  .  clonazePAM (KLONOPIN) 0.5 MG tablet, Take 1 tablet (0.5 mg total) by mouth at bedtime., Disp: 30 tablet, Rfl: 3 .  fenofibrate 160 MG tablet, Take 1 tablet (160 mg total) by mouth daily., Disp: 30 tablet, Rfl: 11 .  Fexofenadine  HCl (MUCINEX ALLERGY PO), Take 1 tablet by mouth daily., Disp: , Rfl:  .  Fluticasone-Umeclidin-Vilant (TRELEGY ELLIPTA) 100-62.5-25 MCG/INH AEPB, Inhale 1 puff into the lungs daily., Disp: 1 each, Rfl: 3 .  MEGARED OMEGA-3 KRILL OIL PO, Take 1 tablet by mouth daily., Disp: , Rfl:  .  sertraline (ZOLOFT) 50 MG tablet, Take 1 tablet (50 mg total) by mouth at bedtime., Disp:  90 tablet, Rfl: 1  Review of Systems  Constitutional: Negative.   HENT: Positive for hearing loss.        Hearing a little worse. Planning evaluation by audiologist and eye exam.  Eyes: Negative.   Respiratory: Negative.   Gastrointestinal:       Occasional indigestion.    Social History   Tobacco Use  . Smoking status: Former Smoker    Types: Cigarettes  . Smokeless tobacco: Never Used  . Tobacco comment: quit in 2012  Substance Use Topics  . Alcohol use: No    Alcohol/week: 0.0 standard drinks    Comment: stopped in 2012     Objective:   BP 122/80 (BP Location: Left Arm, Patient Position: Sitting, Cuff Size: Large)   Pulse 83   Temp 97.6 F (36.4 C) (Oral)   Resp 16   Wt 200 lb (90.7 kg)   SpO2 93%   BMI 29.53 kg/m    Wt Readings from Last 3 Encounters:  03/07/18 200 lb (90.7 kg)  11/01/17 194 lb (88 kg)  07/12/17 189 lb 6.4 oz (85.9 kg)   Vitals:   03/07/18 1424  BP: 122/80  Pulse: 83  Resp: 16  Temp: 97.6 F (36.4 C)  TempSrc: Oral  SpO2: 93%  Weight: 200 lb (90.7 kg)   Physical Exam Constitutional:      General: He is not in acute distress.    Appearance: He is well-developed.  HENT:     Head: Normocephalic and atraumatic.     Right Ear: Hearing and tympanic membrane normal.     Left Ear: Hearing and tympanic membrane normal.     Nose: Nose normal.  Eyes:     General: Lids are normal. No scleral icterus.       Right eye: No discharge.        Left eye: No discharge.     Conjunctiva/sclera: Conjunctivae normal.  Neck:     Musculoskeletal: Normal range of motion and neck supple.  Cardiovascular:     Rate and Rhythm: Normal rate and regular rhythm.     Pulses: Normal pulses.     Heart sounds: Normal heart sounds.  Pulmonary:     Effort: Pulmonary effort is normal. No respiratory distress.  Abdominal:     General: Bowel sounds are normal.     Palpations: Abdomen is soft.  Musculoskeletal: Normal range of motion.        General: Swelling  present.     Comments: Left ankle with 1+ swelling with history of severe trauma when a 55 gallon drum fell on it. No fractures. Elevation helps reduce swelling each day.  Skin:    Findings: No lesion or rash.  Neurological:     Mental Status: He is alert and oriented to person, place, and time.  Psychiatric:        Speech: Speech normal.        Behavior: Behavior normal.        Thought Content: Thought content normal.       Assessment & Plan    1. Mixed hyperlipidemia  Tolerating Megared qd, and Fenofibrate 160 mg qd with attempts to follow a low fat diet. Has gained 11 lbs in the past 8 months. Recheck labs and follow up pending reports. - Comprehensive metabolic panel - Lipid panel - TSH  2. Anxiety disorder, unspecified type No panic attacks or suicidal ideation of depressive reaction. Clonazepam 0.5 mg HS and Zoloft 50 mg HS helps with sleep and anxiety control. Recheck CBC, CMP and TSH. - CBC with Differential/Platelet - Comprehensive metabolic panel - TSH  3. Essential (primary) hypertension BP well controlled on the Amlodipine 5 mg qd. Minimal peripheral edema (except left leg from past trauma). Recheck routine labs and follow up pending reports. - CBC with Differential/Platelet - Comprehensive metabolic panel - TSH  4. COPD, mild (HCC) Fair control of congestion and dyspnea with Trelegy 1 inhalation daily and uses Ventolin-HFA prn wheeze 2 sprays QID. Recheck CBC with diff for signs of infection. - CBC with Differential/Platelet     Dortha Kern, PA  [facility]

## 2018-03-10 DIAGNOSIS — J449 Chronic obstructive pulmonary disease, unspecified: Secondary | ICD-10-CM | POA: Diagnosis not present

## 2018-03-10 DIAGNOSIS — I10 Essential (primary) hypertension: Secondary | ICD-10-CM | POA: Diagnosis not present

## 2018-03-10 DIAGNOSIS — E782 Mixed hyperlipidemia: Secondary | ICD-10-CM | POA: Diagnosis not present

## 2018-03-10 DIAGNOSIS — F419 Anxiety disorder, unspecified: Secondary | ICD-10-CM | POA: Diagnosis not present

## 2018-03-11 ENCOUNTER — Telehealth: Payer: Self-pay

## 2018-03-11 LAB — COMPREHENSIVE METABOLIC PANEL
A/G RATIO: 1.4 (ref 1.2–2.2)
ALT: 6 IU/L (ref 0–44)
AST: 14 IU/L (ref 0–40)
Albumin: 4 g/dL (ref 3.5–4.8)
Alkaline Phosphatase: 63 IU/L (ref 39–117)
BUN / CREAT RATIO: 12 (ref 10–24)
BUN: 16 mg/dL (ref 8–27)
Bilirubin Total: 0.7 mg/dL (ref 0.0–1.2)
CO2: 25 mmol/L (ref 20–29)
Calcium: 9.3 mg/dL (ref 8.6–10.2)
Chloride: 98 mmol/L (ref 96–106)
Creatinine, Ser: 1.33 mg/dL — ABNORMAL HIGH (ref 0.76–1.27)
GFR calc Af Amer: 59 mL/min/{1.73_m2} — ABNORMAL LOW (ref >59)
GFR calc non Af Amer: 51 mL/min/{1.73_m2} — ABNORMAL LOW (ref >59)
GLOBULIN, TOTAL: 2.9 g/dL (ref 1.5–4.5)
Glucose: 100 mg/dL — ABNORMAL HIGH (ref 65–99)
POTASSIUM: 4.9 mmol/L (ref 3.5–5.2)
SODIUM: 139 mmol/L (ref 134–144)
Total Protein: 6.9 g/dL (ref 6.0–8.5)

## 2018-03-11 LAB — CBC WITH DIFFERENTIAL/PLATELET
Basophils Absolute: 0.1 10*3/uL (ref 0.0–0.2)
Basos: 1 %
EOS (ABSOLUTE): 0.3 10*3/uL (ref 0.0–0.4)
Eos: 4 %
HEMATOCRIT: 45.7 % (ref 37.5–51.0)
Hemoglobin: 15.4 g/dL (ref 13.0–17.7)
Immature Grans (Abs): 0 10*3/uL (ref 0.0–0.1)
Immature Granulocytes: 0 %
LYMPHS ABS: 2.3 10*3/uL (ref 0.7–3.1)
Lymphs: 29 %
MCH: 30.4 pg (ref 26.6–33.0)
MCHC: 33.7 g/dL (ref 31.5–35.7)
MCV: 90 fL (ref 79–97)
Monocytes Absolute: 0.7 10*3/uL (ref 0.1–0.9)
Monocytes: 9 %
Neutrophils Absolute: 4.7 10*3/uL (ref 1.4–7.0)
Neutrophils: 57 %
Platelets: 350 10*3/uL (ref 150–450)
RBC: 5.07 x10E6/uL (ref 4.14–5.80)
RDW: 13.1 % (ref 11.6–15.4)
WBC: 8.2 10*3/uL (ref 3.4–10.8)

## 2018-03-11 LAB — LIPID PANEL
Chol/HDL Ratio: 5 ratio (ref 0.0–5.0)
Cholesterol, Total: 165 mg/dL (ref 100–199)
HDL: 33 mg/dL — ABNORMAL LOW (ref >39)
LDL Calculated: 101 mg/dL — ABNORMAL HIGH (ref 0–99)
Triglycerides: 155 mg/dL — ABNORMAL HIGH (ref 0–149)
VLDL Cholesterol Cal: 31 mg/dL (ref 5–40)

## 2018-03-11 LAB — TSH: TSH: 2.07 u[IU]/mL (ref 0.450–4.500)

## 2018-03-11 NOTE — Telephone Encounter (Signed)
Patient's daughter Andrew Hall calling about his labs results and she was advised as directed below.

## 2018-03-11 NOTE — Telephone Encounter (Signed)
-----   Message from Jodell Cipro [place], [place] sent at 03/11/2018  8:28 AM EST ----- Blood tests essentially normal except kidney function tests shows some stress and need for drinking extra water. Triglycerides, HDL and LDL seems stable compared to 4 months ago. Still need to take all medications and recheck progress in 3 months.

## 2018-03-11 NOTE — Telephone Encounter (Signed)
LMTCB

## 2018-05-23 ENCOUNTER — Other Ambulatory Visit: Payer: Self-pay | Admitting: Family Medicine

## 2018-05-23 DIAGNOSIS — I10 Essential (primary) hypertension: Secondary | ICD-10-CM

## 2018-05-23 DIAGNOSIS — F419 Anxiety disorder, unspecified: Secondary | ICD-10-CM

## 2018-05-23 MED ORDER — CLONAZEPAM 0.5 MG PO TABS: 0.5000 mg | ORAL_TABLET | Freq: Every day | ORAL | 3 refills | Status: DC

## 2018-05-23 MED ORDER — AMLODIPINE BESYLATE 5 MG PO TABS: 5.0000 mg | ORAL_TABLET | Freq: Every day | ORAL | 12 refills | Status: AC

## 2018-05-23 NOTE — Telephone Encounter (Signed)
Patient needs refills on Clonazepam and Amlodipine 5 mg. Called to [facility] [address]

## 2018-07-14 ENCOUNTER — Ambulatory Visit: Payer: Self-pay

## 2018-07-25 ENCOUNTER — Other Ambulatory Visit: Payer: Self-pay | Admitting: Family Medicine

## 2018-07-25 DIAGNOSIS — F419 Anxiety disorder, unspecified: Secondary | ICD-10-CM

## 2018-07-25 MED ORDER — SERTRALINE HCL 50 MG PO TABS: 50.0000 mg | ORAL_TABLET | Freq: Every day | ORAL | 1 refills | Status: AC

## 2018-07-25 NOTE — Telephone Encounter (Signed)
Patient needs refills on Sertraline 50 mg. sent to [facility].

## 2018-08-22 ENCOUNTER — Other Ambulatory Visit: Payer: Self-pay | Admitting: Family Medicine

## 2018-08-22 DIAGNOSIS — E782 Mixed hyperlipidemia: Secondary | ICD-10-CM

## 2018-08-22 MED ORDER — FENOFIBRATE 160 MG PO TABS: 160.0000 mg | ORAL_TABLET | Freq: Every day | ORAL | 1 refills | Status: AC

## 2018-08-22 NOTE — Telephone Encounter (Signed)
Patient requesting refills on Fenofibrate 160 mg.   [place]

## 2018-08-22 NOTE — Telephone Encounter (Signed)
LOV 03/07/2018

## 2018-09-22 ENCOUNTER — Other Ambulatory Visit: Payer: Self-pay | Admitting: Family Medicine

## 2018-09-22 DIAGNOSIS — F419 Anxiety disorder, unspecified: Secondary | ICD-10-CM

## 2018-09-22 MED ORDER — CLONAZEPAM 0.5 MG PO TABS: 0.5000 mg | ORAL_TABLET | Freq: Every day | ORAL | 3 refills | Status: AC

## 2018-09-22 NOTE — Telephone Encounter (Signed)
Patient is requesting refill on Clonazepam sent to [facility] on [address].

## 2018-10-26 ENCOUNTER — Emergency Department: Payer: Medicare PPO

## 2018-10-26 ENCOUNTER — Other Ambulatory Visit: Payer: Self-pay

## 2018-10-26 ENCOUNTER — Encounter: Payer: Self-pay | Admitting: Emergency Medicine

## 2018-10-26 ENCOUNTER — Inpatient Hospital Stay
Admission: EM | Admit: 2018-10-26 | Discharge: 2018-11-24 | DRG: 208 | Disposition: E | Payer: Medicare PPO | Attending: Internal Medicine | Admitting: Internal Medicine

## 2018-10-26 ENCOUNTER — Inpatient Hospital Stay: Payer: Medicare PPO

## 2018-10-26 DIAGNOSIS — F419 Anxiety disorder, unspecified: Secondary | ICD-10-CM | POA: Diagnosis not present

## 2018-10-26 DIAGNOSIS — Z4659 Encounter for fitting and adjustment of other gastrointestinal appliance and device: Secondary | ICD-10-CM | POA: Diagnosis not present

## 2018-10-26 DIAGNOSIS — G9340 Encephalopathy, unspecified: Secondary | ICD-10-CM | POA: Diagnosis present

## 2018-10-26 DIAGNOSIS — J9601 Acute respiratory failure with hypoxia: Secondary | ICD-10-CM | POA: Diagnosis present

## 2018-10-26 DIAGNOSIS — E872 Acidosis: Secondary | ICD-10-CM | POA: Diagnosis present

## 2018-10-26 DIAGNOSIS — Z7951 Long term (current) use of inhaled steroids: Secondary | ICD-10-CM | POA: Diagnosis not present

## 2018-10-26 DIAGNOSIS — K449 Diaphragmatic hernia without obstruction or gangrene: Secondary | ICD-10-CM | POA: Diagnosis present

## 2018-10-26 DIAGNOSIS — I509 Heart failure, unspecified: Secondary | ICD-10-CM | POA: Diagnosis not present

## 2018-10-26 DIAGNOSIS — S0990XA Unspecified injury of head, initial encounter: Secondary | ICD-10-CM | POA: Diagnosis not present

## 2018-10-26 DIAGNOSIS — Y92002 Bathroom of unspecified non-institutional (private) residence single-family (private) house as the place of occurrence of the external cause: Secondary | ICD-10-CM | POA: Diagnosis not present

## 2018-10-26 DIAGNOSIS — Z87891 Personal history of nicotine dependence: Secondary | ICD-10-CM

## 2018-10-26 DIAGNOSIS — H9190 Unspecified hearing loss, unspecified ear: Secondary | ICD-10-CM | POA: Diagnosis present

## 2018-10-26 DIAGNOSIS — Z79899 Other long term (current) drug therapy: Secondary | ICD-10-CM

## 2018-10-26 DIAGNOSIS — I5033 Acute on chronic diastolic (congestive) heart failure: Secondary | ICD-10-CM | POA: Diagnosis present

## 2018-10-26 DIAGNOSIS — Z515 Encounter for palliative care: Secondary | ICD-10-CM | POA: Diagnosis not present

## 2018-10-26 DIAGNOSIS — N179 Acute kidney failure, unspecified: Secondary | ICD-10-CM | POA: Diagnosis present

## 2018-10-26 DIAGNOSIS — W19XXXA Unspecified fall, initial encounter: Secondary | ICD-10-CM | POA: Diagnosis present

## 2018-10-26 DIAGNOSIS — Z7189 Other specified counseling: Secondary | ICD-10-CM | POA: Diagnosis not present

## 2018-10-26 DIAGNOSIS — J441 Chronic obstructive pulmonary disease with (acute) exacerbation: Secondary | ICD-10-CM | POA: Diagnosis present

## 2018-10-26 DIAGNOSIS — E875 Hyperkalemia: Secondary | ICD-10-CM | POA: Diagnosis not present

## 2018-10-26 DIAGNOSIS — R0602 Shortness of breath: Secondary | ICD-10-CM

## 2018-10-26 DIAGNOSIS — I110 Hypertensive heart disease with heart failure: Secondary | ICD-10-CM | POA: Diagnosis present

## 2018-10-26 DIAGNOSIS — R001 Bradycardia, unspecified: Secondary | ICD-10-CM | POA: Diagnosis not present

## 2018-10-26 DIAGNOSIS — J90 Pleural effusion, not elsewhere classified: Secondary | ICD-10-CM | POA: Diagnosis not present

## 2018-10-26 DIAGNOSIS — R0902 Hypoxemia: Secondary | ICD-10-CM | POA: Diagnosis not present

## 2018-10-26 DIAGNOSIS — Z20828 Contact with and (suspected) exposure to other viral communicable diseases: Secondary | ICD-10-CM | POA: Diagnosis present

## 2018-10-26 DIAGNOSIS — R531 Weakness: Secondary | ICD-10-CM | POA: Diagnosis not present

## 2018-10-26 DIAGNOSIS — N390 Urinary tract infection, site not specified: Secondary | ICD-10-CM | POA: Diagnosis present

## 2018-10-26 DIAGNOSIS — J690 Pneumonitis due to inhalation of food and vomit: Principal | ICD-10-CM | POA: Diagnosis present

## 2018-10-26 DIAGNOSIS — S199XXA Unspecified injury of neck, initial encounter: Secondary | ICD-10-CM | POA: Diagnosis not present

## 2018-10-26 DIAGNOSIS — Z66 Do not resuscitate: Secondary | ICD-10-CM | POA: Diagnosis not present

## 2018-10-26 DIAGNOSIS — R1310 Dysphagia, unspecified: Secondary | ICD-10-CM | POA: Diagnosis present

## 2018-10-26 DIAGNOSIS — Z8249 Family history of ischemic heart disease and other diseases of the circulatory system: Secondary | ICD-10-CM | POA: Diagnosis not present

## 2018-10-26 DIAGNOSIS — R1312 Dysphagia, oropharyngeal phase: Secondary | ICD-10-CM | POA: Diagnosis not present

## 2018-10-26 DIAGNOSIS — J9602 Acute respiratory failure with hypercapnia: Secondary | ICD-10-CM | POA: Diagnosis not present

## 2018-10-26 DIAGNOSIS — J9600 Acute respiratory failure, unspecified whether with hypoxia or hypercapnia: Secondary | ICD-10-CM | POA: Diagnosis present

## 2018-10-26 DIAGNOSIS — E876 Hypokalemia: Secondary | ICD-10-CM | POA: Diagnosis not present

## 2018-10-26 DIAGNOSIS — R000 Tachycardia, unspecified: Secondary | ICD-10-CM | POA: Diagnosis not present

## 2018-10-26 DIAGNOSIS — R0689 Other abnormalities of breathing: Secondary | ICD-10-CM | POA: Diagnosis not present

## 2018-10-26 DIAGNOSIS — Z4682 Encounter for fitting and adjustment of non-vascular catheter: Secondary | ICD-10-CM | POA: Diagnosis not present

## 2018-10-26 DIAGNOSIS — R092 Respiratory arrest: Secondary | ICD-10-CM | POA: Diagnosis not present

## 2018-10-26 LAB — CBC WITH DIFFERENTIAL/PLATELET
Abs Immature Granulocytes: 0.22 10*3/uL — ABNORMAL HIGH (ref 0.00–0.07)
Basophils Absolute: 0.1 10*3/uL (ref 0.0–0.1)
Basophils Relative: 0 %
Eosinophils Absolute: 0 10*3/uL (ref 0.0–0.5)
Eosinophils Relative: 0 %
HCT: 50.2 % (ref 39.0–52.0)
Hemoglobin: 14.6 g/dL (ref 13.0–17.0)
Immature Granulocytes: 2 %
Lymphocytes Relative: 6 %
Lymphs Abs: 0.7 10*3/uL (ref 0.7–4.0)
MCH: 27.4 pg (ref 26.0–34.0)
MCHC: 29.1 g/dL — ABNORMAL LOW (ref 30.0–36.0)
MCV: 94.4 fL (ref 80.0–100.0)
Monocytes Absolute: 1.2 10*3/uL — ABNORMAL HIGH (ref 0.1–1.0)
Monocytes Relative: 11 %
Neutro Abs: 9.3 10*3/uL — ABNORMAL HIGH (ref 1.7–7.7)
Neutrophils Relative %: 81 %
Platelets: 450 10*3/uL — ABNORMAL HIGH (ref 150–400)
RBC: 5.32 MIL/uL (ref 4.22–5.81)
RDW: 16.4 % — ABNORMAL HIGH (ref 11.5–15.5)
WBC: 11.4 10*3/uL — ABNORMAL HIGH (ref 4.0–10.5)
nRBC: 0.9 % — ABNORMAL HIGH (ref 0.0–0.2)

## 2018-10-26 LAB — BASIC METABOLIC PANEL
Anion gap: 10 (ref 5–15)
BUN: 46 mg/dL — ABNORMAL HIGH (ref 8–23)
CO2: 35 mmol/L — ABNORMAL HIGH (ref 22–32)
Calcium: 9.2 mg/dL (ref 8.9–10.3)
Chloride: 94 mmol/L — ABNORMAL LOW (ref 98–111)
Creatinine, Ser: 1.56 mg/dL — ABNORMAL HIGH (ref 0.61–1.24)
GFR calc Af Amer: 49 mL/min — ABNORMAL LOW (ref >60)
GFR calc non Af Amer: 42 mL/min — ABNORMAL LOW (ref >60)
Glucose, Bld: 142 mg/dL — ABNORMAL HIGH (ref 70–99)
Potassium: 5.3 mmol/L — ABNORMAL HIGH (ref 3.5–5.1)
Sodium: 139 mmol/L (ref 135–145)

## 2018-10-26 LAB — BLOOD GAS, ARTERIAL
Acid-Base Excess: 14 mmol/L — ABNORMAL HIGH (ref 0.0–2.0)
Bicarbonate: 40.5 mmol/L — ABNORMAL HIGH (ref 20.0–28.0)
FIO2: 50
MECHVT: 500 mL
O2 Saturation: 94.9 %
PEEP: 5 cmH2O
Patient temperature: 37
RATE: 20 resp/min
pCO2 arterial: 57 mmHg — ABNORMAL HIGH (ref 32.0–48.0)
pH, Arterial: 7.46 — ABNORMAL HIGH (ref 7.350–7.450)
pO2, Arterial: 71 mmHg — ABNORMAL LOW (ref 83.0–108.0)

## 2018-10-26 LAB — TRIGLYCERIDES: Triglycerides: 84 mg/dL (ref <150)

## 2018-10-26 LAB — BLOOD GAS, VENOUS
Acid-Base Excess: 8.7 mmol/L — ABNORMAL HIGH (ref 0.0–2.0)
Bicarbonate: 42.6 mmol/L — ABNORMAL HIGH (ref 20.0–28.0)
O2 Saturation: 67 %
Patient temperature: 37
pCO2, Ven: >120 mmHg (ref 44.0–60.0)
pH, Ven: 7.14 — CL (ref 7.250–7.430)
pO2, Ven: 46 mmHg — ABNORMAL HIGH (ref 32.0–45.0)

## 2018-10-26 LAB — GLUCOSE, CAPILLARY: Glucose-Capillary: 153 mg/dL — ABNORMAL HIGH (ref 70–99)

## 2018-10-26 LAB — MRSA PCR SCREENING: MRSA by PCR: NEGATIVE

## 2018-10-26 LAB — TROPONIN I (HIGH SENSITIVITY)
Troponin I (High Sensitivity): 40 ng/L — ABNORMAL HIGH (ref <18)
Troponin I (High Sensitivity): 52 ng/L — ABNORMAL HIGH (ref <18)

## 2018-10-26 LAB — SARS CORONAVIRUS 2 BY RT PCR (HOSPITAL ORDER, PERFORMED IN ~~LOC~~ HOSPITAL LAB): SARS Coronavirus 2: NEGATIVE

## 2018-10-26 LAB — BRAIN NATRIURETIC PEPTIDE: B Natriuretic Peptide: 344 pg/mL — ABNORMAL HIGH (ref 0.0–100.0)

## 2018-10-26 MED ORDER — SODIUM CHLORIDE 0.9 % IV SOLN
1.0000 g | INTRAVENOUS | Status: AC
  Administered 2018-10-27 – 2018-10-30 (×4): 1 g via INTRAVENOUS
  Filled 2018-10-26 (×4): qty 1

## 2018-10-26 MED ORDER — TRAZODONE HCL 50 MG PO TABS: 25.0000 mg | ORAL_TABLET | Freq: Every evening | ORAL | Status: DC | PRN

## 2018-10-26 MED ORDER — UMECLIDINIUM BROMIDE 62.5 MCG/INH IN AEPB
1.0000 | INHALATION_SPRAY | Freq: Every day | RESPIRATORY_TRACT | Status: DC
  Administered 2018-10-28 – 2018-10-30 (×3): 1 via RESPIRATORY_TRACT
  Filled 2018-10-26 (×2): qty 7

## 2018-10-26 MED ORDER — AMLODIPINE BESYLATE 5 MG PO TABS: 5.0000 mg | ORAL_TABLET | Freq: Every day | ORAL | Status: DC

## 2018-10-26 MED ORDER — SERTRALINE HCL 50 MG PO TABS
50.0000 mg | ORAL_TABLET | Freq: Every day | ORAL | Status: DC
  Administered 2018-10-26: 21:00:00 50 mg
  Filled 2018-10-26: qty 1

## 2018-10-26 MED ORDER — PHENYLEPHRINE HCL-NACL 10-0.9 MG/250ML-% IV SOLN
0.0000 ug/min | INTRAVENOUS | Status: DC
  Filled 2018-10-26: qty 250

## 2018-10-26 MED ORDER — FENTANYL CITRATE (PF) 100 MCG/2ML IJ SOLN
50.0000 ug | INTRAMUSCULAR | Status: DC | PRN
  Administered 2018-10-27: 50 ug via INTRAVENOUS
  Filled 2018-10-26: qty 2

## 2018-10-26 MED ORDER — AMLODIPINE BESYLATE 5 MG PO TABS
5.0000 mg | ORAL_TABLET | Freq: Every day | ORAL | Status: DC
  Administered 2018-10-28: 11:00:00 5 mg
  Filled 2018-10-26: qty 1

## 2018-10-26 MED ORDER — FENTANYL CITRATE (PF) 100 MCG/2ML IJ SOLN
50.0000 ug | INTRAMUSCULAR | Status: DC | PRN
  Administered 2018-10-26: 14:00:00 50 ug via INTRAVENOUS
  Filled 2018-10-26: qty 2

## 2018-10-26 MED ORDER — FEXOFENADINE HCL 180 MG PO TABS
180.0000 mg | ORAL_TABLET | Freq: Every day | ORAL | Status: DC
  Administered 2018-10-27 – 2018-10-28 (×2): 180 mg
  Filled 2018-10-26 (×2): qty 1

## 2018-10-26 MED ORDER — CHLORHEXIDINE GLUCONATE 0.12% ORAL RINSE (MEDLINE KIT)
15.0000 mL | Freq: Two times a day (BID) | OROMUCOSAL | Status: DC
  Administered 2018-10-26 – 2018-10-27 (×2): 15 mL via OROMUCOSAL

## 2018-10-26 MED ORDER — SERTRALINE HCL 50 MG PO TABS: 50.0000 mg | ORAL_TABLET | Freq: Every day | ORAL | Status: DC

## 2018-10-26 MED ORDER — FUROSEMIDE 10 MG/ML IJ SOLN
40.0000 mg | Freq: Once | INTRAMUSCULAR | Status: AC
  Administered 2018-10-26: 10:00:00 40 mg via INTRAVENOUS
  Filled 2018-10-26: qty 4

## 2018-10-26 MED ORDER — METHYLPREDNISOLONE SODIUM SUCC 125 MG IJ SOLR
60.0000 mg | INTRAMUSCULAR | Status: DC
  Administered 2018-10-26 – 2018-10-31 (×5): 60 mg via INTRAVENOUS
  Filled 2018-10-26 (×5): qty 2

## 2018-10-26 MED ORDER — DOCUSATE SODIUM 100 MG PO CAPS: 100.0000 mg | ORAL_CAPSULE | Freq: Two times a day (BID) | ORAL | Status: DC

## 2018-10-26 MED ORDER — SODIUM CHLORIDE 0.9 % IV SOLN
500.0000 mg | Freq: Once | INTRAVENOUS | Status: AC
  Administered 2018-10-26: 12:00:00 500 mg via INTRAVENOUS
  Filled 2018-10-26: qty 500

## 2018-10-26 MED ORDER — SODIUM CHLORIDE 0.9 % IV SOLN
0.0000 ug/min | INTRAVENOUS | Status: DC
  Administered 2018-10-26 (×2): 20 ug/min via INTRAVENOUS
  Administered 2018-10-27: 15 ug/min via INTRAVENOUS
  Filled 2018-10-26 (×3): qty 10

## 2018-10-26 MED ORDER — SODIUM CHLORIDE 0.9 % IV SOLN
1.0000 g | Freq: Once | INTRAVENOUS | Status: AC
  Administered 2018-10-26: 11:00:00 1 g via INTRAVENOUS
  Filled 2018-10-26: qty 10

## 2018-10-26 MED ORDER — METHYLPREDNISOLONE SODIUM SUCC 125 MG IJ SOLR
125.0000 mg | Freq: Once | INTRAMUSCULAR | Status: AC
  Administered 2018-10-26: 125 mg via INTRAVENOUS
  Filled 2018-10-26: qty 2

## 2018-10-26 MED ORDER — FLUTICASONE FUROATE-VILANTEROL 100-25 MCG/INH IN AEPB
1.0000 | INHALATION_SPRAY | Freq: Every day | RESPIRATORY_TRACT | Status: DC
  Administered 2018-10-28 – 2018-10-30 (×3): 1 via RESPIRATORY_TRACT
  Filled 2018-10-26 (×2): qty 28

## 2018-10-26 MED ORDER — CLONAZEPAM 0.5 MG PO TABS
0.5000 mg | ORAL_TABLET | Freq: Every day | ORAL | Status: DC
  Administered 2018-10-26: 21:00:00 0.5 mg
  Filled 2018-10-26: qty 1

## 2018-10-26 MED ORDER — ONDANSETRON HCL 4 MG PO TABS: 4.0000 mg | ORAL_TABLET | Freq: Four times a day (QID) | ORAL | Status: DC | PRN

## 2018-10-26 MED ORDER — IPRATROPIUM-ALBUTEROL 0.5-2.5 (3) MG/3ML IN SOLN
3.0000 mL | RESPIRATORY_TRACT | Status: DC
  Administered 2018-10-26 – 2018-10-29 (×17): 3 mL via RESPIRATORY_TRACT
  Filled 2018-10-26 (×17): qty 3

## 2018-10-26 MED ORDER — FEXOFENADINE HCL 180 MG PO TABS
180.0000 mg | ORAL_TABLET | Freq: Every day | ORAL | Status: DC
  Filled 2018-10-26: qty 1

## 2018-10-26 MED ORDER — FUROSEMIDE 10 MG/ML IJ SOLN
20.0000 mg | Freq: Every day | INTRAMUSCULAR | Status: DC
  Administered 2018-10-28: 11:00:00 20 mg via INTRAVENOUS
  Filled 2018-10-26 (×2): qty 2

## 2018-10-26 MED ORDER — CLONAZEPAM 0.5 MG PO TABS: 0.5000 mg | ORAL_TABLET | Freq: Every day | ORAL | Status: DC

## 2018-10-26 MED ORDER — BISACODYL 5 MG PO TBEC: 5.0000 mg | DELAYED_RELEASE_TABLET | Freq: Every day | ORAL | Status: DC | PRN

## 2018-10-26 MED ORDER — DOCUSATE SODIUM 50 MG/5ML PO LIQD
100.0000 mg | Freq: Two times a day (BID) | ORAL | Status: DC
  Administered 2018-10-26 – 2018-10-30 (×7): 100 mg
  Filled 2018-10-26 (×8): qty 10

## 2018-10-26 MED ORDER — ACETAMINOPHEN 650 MG RE SUPP: 650.0000 mg | Freq: Four times a day (QID) | RECTAL | Status: DC | PRN

## 2018-10-26 MED ORDER — ONDANSETRON HCL 4 MG/2ML IJ SOLN
4.0000 mg | Freq: Four times a day (QID) | INTRAMUSCULAR | Status: DC | PRN
  Administered 2018-10-30: 4 mg via INTRAVENOUS
  Filled 2018-10-26: qty 2

## 2018-10-26 MED ORDER — CHLORHEXIDINE GLUCONATE CLOTH 2 % EX PADS
6.0000 | MEDICATED_PAD | Freq: Every day | CUTANEOUS | Status: DC
  Administered 2018-10-26 – 2018-10-28 (×3): 6 via TOPICAL

## 2018-10-26 MED ORDER — ORAL CARE MOUTH RINSE
15.0000 mL | OROMUCOSAL | Status: DC
  Administered 2018-10-26 – 2018-10-27 (×10): 15 mL via OROMUCOSAL

## 2018-10-26 MED ORDER — IPRATROPIUM-ALBUTEROL 0.5-2.5 (3) MG/3ML IN SOLN
3.0000 mL | RESPIRATORY_TRACT | Status: AC | PRN
  Administered 2018-10-26 (×3): 3 mL via RESPIRATORY_TRACT
  Filled 2018-10-26 (×3): qty 3

## 2018-10-26 MED ORDER — FLUTICASONE-UMECLIDIN-VILANT 100-62.5-25 MCG/INH IN AEPB: 1.0000 | INHALATION_SPRAY | Freq: Every day | RESPIRATORY_TRACT | Status: DC

## 2018-10-26 MED ORDER — ENOXAPARIN SODIUM 40 MG/0.4ML ~~LOC~~ SOLN
40.0000 mg | SUBCUTANEOUS | Status: DC
  Administered 2018-10-26 – 2018-10-30 (×5): 40 mg via SUBCUTANEOUS
  Filled 2018-10-26 (×5): qty 0.4

## 2018-10-26 MED ORDER — DEXTROSE 5 % IV SOLN
250.0000 mg | INTRAVENOUS | Status: DC
  Administered 2018-10-27 – 2018-10-28 (×2): 250 mg via INTRAVENOUS
  Filled 2018-10-26 (×2): qty 250

## 2018-10-26 MED ORDER — ACETAMINOPHEN 325 MG PO TABS
650.0000 mg | ORAL_TABLET | Freq: Four times a day (QID) | ORAL | Status: DC | PRN
  Administered 2018-10-28: 650 mg via ORAL
  Filled 2018-10-26: qty 2

## 2018-10-26 MED ORDER — PROPOFOL 1000 MG/100ML IV EMUL
0.0000 ug/kg/min | INTRAVENOUS | Status: DC
  Administered 2018-10-26: 22:00:00 30 ug/kg/min via INTRAVENOUS
  Administered 2018-10-26: 5 ug/kg/min via INTRAVENOUS
  Administered 2018-10-27: 10:00:00 50 ug/kg/min via INTRAVENOUS
  Administered 2018-10-27: 04:00:00 30 ug/kg/min via INTRAVENOUS
  Filled 2018-10-26 (×4): qty 100

## 2018-10-26 NOTE — ED Notes (Signed)
Date and time results received: 11/08/2018  (use smartphrase ".now" to insert current time)  Test: venous blood gas, pH and PCO2 Critical Value: pH- 7.14, PCO2- >120  Name of Provider Notified: Dr. Si Raider  Orders Received? Or Actions Taken?:

## 2018-10-26 NOTE — ED Notes (Signed)
Report given to Brandy RN

## 2018-10-26 NOTE — ED Notes (Signed)
Dr. Charna Archer and respiratory at bedside for intubation

## 2018-10-26 NOTE — Progress Notes (Signed)
Patient transported via transport vent to [place].  Patient tolerated well.

## 2018-10-26 NOTE — ED Notes (Addendum)
OG attempted by Mateo Flow RN with no success

## 2018-10-26 NOTE — ED Notes (Signed)
NG attempted by Marya Amsler with no success

## 2018-10-26 NOTE — ED Notes (Signed)
Pt on bipap at this time.

## 2018-10-26 NOTE — Progress Notes (Addendum)
Severe ABG showed severe respiratory acidosis, patient needs intubation.

## 2018-10-26 NOTE — H&P (Signed)
[facility] [place] NAME: Andrew Hall    MR#:  [id number]  DATE OF BIRTH:  April 21, 1940  DATE OF ADMISSION:  11/16/2018  PRIMARY CARE PHYSICIAN: Chrismon, Vickki Muff, PA   REQUESTING/REFERRING PHYSICIAN: Dr. Charna Archer  CHIEF COMPLAINT: Shortness of breath   Chief Complaint  Patient presents with  . Weakness  . Fall    HISTORY OF PRESENT ILLNESS:  Andrew Hall  is a 78 y.o. male with a known history of COPD, anxiety comes in because fall in the bathroom and brought in here and found to have hypoxia with oxygen saturation 71% on room air, patient received multiple dose of nebulizers, steroids, Lasix in the emergency room, blood work is essentially normal except slight dehydration, COVID-19 test is negative, patient chest x-ray is content concerning for bibasilar pneumonia/pulmonary edema, patient is admitted to medical service for COPD exacerbation.  Patient is lethargic, ABG is done but results are pending.  CT head, CT of cervical spine done in the emergency room did not show any acute changes.  Patient noted to have BNP of 344. PAST MEDICAL HISTORY:  History reviewed. No pertinent past medical history.  PAST SURGICAL HISTOIRY:   Past Surgical History:  Procedure Laterality Date  . HERNIA REPAIR Bilateral 08/08/2010    SOCIAL HISTORY:   Social History   Tobacco Use  . Smoking status: Former Smoker    Types: Cigarettes  . Smokeless tobacco: Never Used  . Tobacco comment: quit in 2012  Substance Use Topics  . Alcohol use: No    Alcohol/week: 0.0 standard drinks    Comment: stopped in 2012    FAMILY HISTORY:   Family History  Problem Relation Age of Onset  . Heart disease Mother     DRUG ALLERGIES:  No Known Allergies  REVIEW OF SYSTEMS:   unable to obtain review of systems because patient is lethargic.  MEDICATIONS AT HOME:   Prior to Admission medications   Medication Sig Start Date End Date Taking? Authorizing  Provider  albuterol (VENTOLIN HFA) 108 (90 Base) MCG/ACT inhaler Inhale 2 puffs into the lungs every 6 (six) hours as needed for wheezing or shortness of breath. 04/13/17  Yes Chrismon, Vickki Muff, PA  amLODipine (NORVASC) 5 MG tablet Take 1 tablet (5 mg total) by mouth daily. 05/23/18  Yes Chrismon, Vickki Muff, PA  B Complex Vitamins (VITAMIN B COMPLEX PO) Take by mouth daily.   Yes [provider]  clonazePAM (KLONOPIN) 0.5 MG tablet Take 1 tablet (0.5 mg total) by mouth at bedtime. 09/22/18  Yes Chrismon, Vickki Muff, PA  fenofibrate 160 MG tablet Take 1 tablet (160 mg total) by mouth daily. 08/22/18  Yes Chrismon, Vickki Muff, PA  Fexofenadine HCl (MUCINEX ALLERGY PO) Take 1 tablet by mouth daily.   Yes [provider]  Fluticasone-Umeclidin-Vilant (TRELEGY ELLIPTA) 100-62.5-25 MCG/INH AEPB Inhale 1 puff into the lungs daily. 03/30/17  Yes Chrismon, Vickki Muff, PA  MEGARED OMEGA-3 KRILL OIL PO Take 1 tablet by mouth daily.   Yes [provider]  sertraline (ZOLOFT) 50 MG tablet Take 1 tablet (50 mg total) by mouth at bedtime. 07/25/18  Yes Chrismon, Vickki Muff, PA      VITAL SIGNS:  Blood pressure (!) 163/78, pulse 93, temperature 98.6 F (37 C), temperature source Oral, resp. rate 20, height 6' (1.829 m), weight 90.7 kg, SpO2 92 %.  PHYSICAL EXAMINATION:  GENERAL:  78 y.o.-year-old patient lying in the bed .hard of hearing  and also patient lethargic.    EYES: Pupils equal, round, reactive to light ., No scleral icterus. Extraocular muscles intact.  HEENT: Head atraumatic, normocephalic. Oropharynx and nasopharynx clear.  NECK:  Supple, no jugular venous distention. No thyroid enlargement, no tenderness.  LUNGS:  diminished air entry bilaterally. CARDIOVASCULAR: S1, S2 normal. No murmurs, rubs, or gallops.  ABDOMEN: Soft, nontender, nondistended. Bowel sounds present. No organomegaly or mass.  EXTREMITIES: 2+ pitting edema bilaterally.  NEUROLOGIC: Lethargic, not able to follow  commands for full neuro exam.  PSYCHIATRIC:  lethargic.  SKIN: No obvious rash, lesion, or ulcer.   LABORATORY PANEL:   CBC Recent Labs  Lab November 10, 2018 0757  WBC 11.4*  HGB 14.6  HCT 50.2  PLT 450*   ------------------------------------------------------------------------------------------------------------------  Chemistries  Recent Labs  Lab 2018-11-10 0757  NA 139  K 5.3*  CL 94*  CO2 35*  GLUCOSE 142*  BUN 46*  CREATININE 1.56*  CALCIUM 9.2   ------------------------------------------------------------------------------------------------------------------  Cardiac Enzymes No results for input(s): TROPONINI in the last 168 hours. ------------------------------------------------------------------------------------------------------------------  RADIOLOGY:  Ct Head Wo Contrast  Result Date: 11/10/2018 CLINICAL DATA:  78 year old male with head and neck injury following fall. Initial encounter. EXAM: CT HEAD WITHOUT CONTRAST CT CERVICAL SPINE WITHOUT CONTRAST TECHNIQUE: Multidetector CT imaging of the head and cervical spine was performed following the standard protocol without intravenous contrast. Multiplanar CT image reconstructions of the cervical spine were also generated. COMPARISON:  None. FINDINGS: CT HEAD FINDINGS Brain: No evidence of acute infarction, hemorrhage, hydrocephalus, extra-axial collection or mass lesion/mass effect. Mild generalized cerebral volume loss noted. Vascular: No hyperdense vessel or unexpected calcification. Skull: Normal. Negative for fracture or focal lesion. Sinuses/Orbits: No acute finding. Other: None. CT CERVICAL SPINE FINDINGS Alignment: Normal. Skull base and vertebrae: No acute fracture. No primary bone lesion or focal pathologic process. Soft tissues and spinal canal: No prevertebral fluid or swelling. No visible canal hematoma. Disc levels: Moderate multilevel degenerative disc disease and facet arthropathy noted. Contributing to LEFT bony  foraminal narrowing at C4-5 and biforaminal bony narrowing at C5-6. Upper chest: No acute abnormality Other: None IMPRESSION: 1. No evidence of acute intracranial abnormality. 2. No static evidence of acute injury to the cervical spine. Multilevel degenerative changes contributing to bony foraminal narrowing as discussed. Electronically Signed   By: Harmon Pier M.D.   On: 10-Nov-2018 09:30   Ct Cervical Spine Wo Contrast  Result Date: Nov 10, 2018 CLINICAL DATA:  78 year old male with head and neck injury following fall. Initial encounter. EXAM: CT HEAD WITHOUT CONTRAST CT CERVICAL SPINE WITHOUT CONTRAST TECHNIQUE: Multidetector CT imaging of the head and cervical spine was performed following the standard protocol without intravenous contrast. Multiplanar CT image reconstructions of the cervical spine were also generated. COMPARISON:  None. FINDINGS: CT HEAD FINDINGS Brain: No evidence of acute infarction, hemorrhage, hydrocephalus, extra-axial collection or mass lesion/mass effect. Mild generalized cerebral volume loss noted. Vascular: No hyperdense vessel or unexpected calcification. Skull: Normal. Negative for fracture or focal lesion. Sinuses/Orbits: No acute finding. Other: None. CT CERVICAL SPINE FINDINGS Alignment: Normal. Skull base and vertebrae: No acute fracture. No primary bone lesion or focal pathologic process. Soft tissues and spinal canal: No prevertebral fluid or swelling. No visible canal hematoma. Disc levels: Moderate multilevel degenerative disc disease and facet arthropathy noted. Contributing to LEFT bony foraminal narrowing at C4-5 and biforaminal bony narrowing at C5-6. Upper chest: No acute abnormality Other: None IMPRESSION: 1. No evidence of acute intracranial abnormality. 2. No static evidence of acute injury to  the cervical spine. Multilevel degenerative changes contributing to bony foraminal narrowing as discussed. Electronically Signed   By: Harmon PierJeffrey  Hu M.D.   On: 11/17/2018 09:30    Dg Chest Portable 1 View  Result Date: 11/16/2018 CLINICAL DATA:  Acute shortness of breath EXAM: PORTABLE CHEST 1 VIEW COMPARISON:  04/12/2017 FINDINGS: Bilateral airspace opacities are noted, greatest within the LEFT mid lung. Cardiomegaly is present. No pleural effusion or pneumothorax. No acute bony abnormalities are identified. IMPRESSION: Bilateral airspace opacities, LEFT-greater-than-RIGHT. Question asymmetric edema and/or infection. Electronically Signed   By: Harmon PierJeffrey  Hu M.D.   On: 10/27/2018 08:31    EKG:   Orders placed or performed during the hospital encounter of 10/28/2018  . EKG 12-Lead  . EKG 12-Lead    IMPRESSION AND PLAN:  78 year old male patient with history of high essential hypertension, anxiety, history of COPD comes in because of fall found to have hypoxia. 1.  Acute respiratory failure with hypoxia secondary to COPD and CHF exacerbation. 1.  Acute COPD exacerbation, patient is receiving bronchodilators, already received IV steroids for today, continue IV steroids 60 mg daily from tomorrow, continue bronchodilators, empiric antibiotics, oxygen. 2.  Acute CHF with elevated BNP, unknown systolic or diastolic, check echocardiogram, continue IV diuretics, monitor renal per renal parameters closely. 3.  Lethargy, patient ABG is done but results are pending if patient noted to have hypercapnia patient needs BiPAP and will need to admit the patient to stepdown unit.  Will send the urinalysis also.   All the records are reviewed and case discussed with ED provider. Management plans discussed with the patient, family and they are in agreement.  CODE STATUS: Full code  TOTAL TIME TAKING CARE OF THIS PATIENT: 55 minutes.    Katha HammingSnehalatha Charitie Hinote M.D on 11/10/2018 at 10:43 AM  Between 7am to 6pm - Pager - [phone]  After 6pm go to [email or web address] - password [email or web address] [facility]  [facility]  Office  [phone]  CC: Primary care physician; Tamsen Roershrismon, Dennis E,  PA  Note: This dictation was prepared with Dragon dictation along with smaller phrase technology. Any transcriptional errors that result from this process are unintentional.

## 2018-10-26 NOTE — ED Notes (Signed)
100mg  rocuronium given at 1159am

## 2018-10-26 NOTE — ED Provider Notes (Signed)
[facility] Emergency Department Provider Note   ____________________________________________   First MD Initiated Contact with Patient 11/20/2018 [phone]     (approximate)  I have reviewed the triage vital signs and the nursing notes.   HISTORY  Chief Complaint Weakness and Fall    HPI Andrew Hall is a 78 y.o. male with past medical history of hypertension and COPD who presents to the ED with weakness and fall.  History is limited as patient is very hard of hearing.  Per EMS, patient was found to fallen in the bathroom by family earlier this morning.  They had reported that he had been increasingly weak over the past couple of days.  EMS was not able to locate any traumatic injuries, but found patient to be hypoxic on room air to 79%.  He does not wear oxygen at home, was placed on 4 L nasal cannula with improvement.  Patient admits to feeling somewhat short of breath, but denies any fevers, cough, or chest pain.        History reviewed. No pertinent past medical history.  Patient Active Problem List   Diagnosis Date Noted  . Acute respiratory failure with hypoxemia ([place]) 11/19/2018  . Acute respiratory failure with hypoxia and hypercapnia ([place]) 10/30/2018  . Acute respiratory failure ([place]) 11/22/2018  . Chronic cough 12/03/2015  . Abnormal blood chemistry 06/27/2014  . Adjustment disorder with mixed anxiety and depressed mood 06/27/2014  . Anxiety disorder 06/27/2014  . Chest pain on exertion 06/27/2014  . COPD, mild ([place]) 06/27/2014  . Acid indigestion 06/27/2014  . Can't get food down 06/27/2014  . Breath shortness 06/27/2014  . Herpes zona 06/27/2014  . History of digestive disease 06/27/2014  . Hydrocele 06/27/2014  . HLD (hyperlipidemia) 06/27/2014  . Benign hypertension 06/27/2014  . BP (high blood pressure) 06/27/2014  . 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 Cerumen impaction 06/27/2014  . Pruritic dermatitis 06/27/2014  . Restless leg  06/27/2014  . 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 Unexplained weight loss 06/27/2014  . Inguinal hernia recurrent bilateral 08/28/2008  . 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 Malaise and fatigue 12/17/2006  . Accumulation of fluid in tissues 07/26/2006  . Essential (primary) hypertension 07/26/2006  . Tobacco use 07/26/2006    Past Surgical History:  Procedure Laterality Date  . HERNIA REPAIR Bilateral 08/08/2010    Prior to Admission medications   Medication Sig Start Date End Date Taking? Authorizing Provider  albuterol (VENTOLIN HFA) 108 (90 Base) MCG/ACT inhaler Inhale 2 puffs into the lungs every 6 (six) hours as needed for wheezing or shortness of breath. 04/13/17  Yes Chrismon, Vickki Muff, PA  amLODipine (NORVASC) 5 MG tablet Take 1 tablet (5 mg total) by mouth daily. 05/23/18  Yes Chrismon, Vickki Muff, PA  B Complex Vitamins (VITAMIN B COMPLEX PO) Take by mouth daily.   Yes [provider]  clonazePAM (KLONOPIN) 0.5 MG tablet Take 1 tablet (0.5 mg total) by mouth at bedtime. 09/22/18  Yes Chrismon, Vickki Muff, PA  fenofibrate 160 MG tablet Take 1 tablet (160 mg total) by mouth daily. 08/22/18  Yes Chrismon, Vickki Muff, PA  Fexofenadine HCl (MUCINEX ALLERGY PO) Take 1 tablet by mouth daily.   Yes [provider]  Fluticasone-Umeclidin-Vilant (TRELEGY ELLIPTA) 100-62.5-25 MCG/INH AEPB Inhale 1 puff into the lungs daily. 03/30/17  Yes Chrismon, Vickki Muff, PA  MEGARED OMEGA-3 KRILL OIL PO Take 1 tablet by mouth daily.   Yes [provider]  sertraline (ZOLOFT) 50 MG  tablet Take 1 tablet (50 mg total) by mouth at bedtime. 07/25/18  Yes Chrismon, Vickki Muff, PA    Allergies Patient has no known allergies.  Family History  Problem Relation Age of Onset  . Heart disease Mother     Social History Social History   Tobacco Use  . Smoking status: Former Smoker    Types: Cigarettes  . Smokeless tobacco: Never Used  . Tobacco comment: quit in 2012  Substance Use Topics  .  Alcohol use: No    Alcohol/week: 0.0 standard drinks    Comment: stopped in 2012  . Drug use: No    Review of Systems  Constitutional: No fever/chills Eyes: No visual changes. ENT: No sore throat. Cardiovascular: Denies chest pain. Respiratory: Positive for shortness of breath. Gastrointestinal: No abdominal pain.  No nausea, no vomiting.  No diarrhea.  No constipation. Genitourinary: Negative for dysuria. Musculoskeletal: Negative for back pain. Skin: Negative for rash. Neurological: Negative for headaches, focal weakness or numbness.  ____________________________________________   PHYSICAL EXAM:  VITAL SIGNS: ED Triage Vitals  Enc Vitals Group     BP 10/30/2018 0747 (!) 174/87     Pulse Rate 11/17/2018 0747 100     Resp 11/13/2018 0747 (!) 27     Temp 10/29/2018 0747 98.6 F (37 C)     Temp Source 10/25/2018 0747 Oral     SpO2 10/28/2018 0745 97 %     Weight 10/25/2018 0750 200 lb (90.7 kg)     Height 10/29/2018 0750 6' (1.829 m)     Head Circumference --      Peak Flow --      Pain Score 11/10/2018 0749 0     Pain Loc --      Pain Edu? --      Excl. in [place]? --     Constitutional: Alert and oriented. Eyes: Conjunctivae are normal. Head: Atraumatic. Nose: No congestion/rhinnorhea. Mouth/Throat: Mucous membranes are moist. Neck: Normal ROM Cardiovascular: Normal rate, regular rhythm. Grossly normal heart sounds. Respiratory: Tachypneic.  No retractions. Lungs with expiratory wheezing throughout, crackles at bilateral bases. Gastrointestinal: Soft and nontender. No distention. Genitourinary: deferred Musculoskeletal: No lower extremity tenderness, 2+ pitting edema to bilateral mid shins. Neurologic:  Normal speech and language. No gross focal neurologic deficits are appreciated. Skin:  Skin is warm, dry and intact. No rash noted. Psychiatric: Mood and affect are normal. Speech and behavior are normal.  ____________________________________________   LABS (all labs ordered are  listed, but only abnormal results are displayed)  Labs Reviewed  BASIC METABOLIC PANEL - Abnormal; Notable for the following components:      Result Value   Potassium 5.3 (*)    Chloride 94 (*)    CO2 35 (*)    Glucose, Bld 142 (*)    BUN 46 (*)    Creatinine, Ser 1.56 (*)    GFR calc non Af Amer 42 (*)    GFR calc Af Amer 49 (*)    All other components within normal limits  BRAIN NATRIURETIC PEPTIDE - Abnormal; Notable for the following components:   B Natriuretic Peptide 344.0 (*)    All other components within normal limits  CBC WITH DIFFERENTIAL/PLATELET - Abnormal; Notable for the following components:   WBC 11.4 (*)    MCHC 29.1 (*)    RDW 16.4 (*)    Platelets 450 (*)    nRBC 0.9 (*)    Neutro Abs 9.3 (*)    Monocytes Absolute 1.2 (*)  Abs Immature Granulocytes 0.22 (*)    All other components within normal limits  BLOOD GAS, VENOUS - Abnormal; Notable for the following components:   pH, Ven 7.14 (*)    pCO2, Ven >120.0 (*)    pO2, Ven 46.0 (*)    Bicarbonate 42.6 (*)    Acid-Base Excess 8.7 (*)    All other components within normal limits  GLUCOSE, CAPILLARY - Abnormal; Notable for the following components:   Glucose-Capillary 153 (*)    All other components within normal limits  TROPONIN I (HIGH SENSITIVITY) - Abnormal; Notable for the following components:   Troponin I (High Sensitivity) 40 (*)    All other components within normal limits  TROPONIN I (HIGH SENSITIVITY) - Abnormal; Notable for the following components:   Troponin I (High Sensitivity) 52 (*)    All other components within normal limits  SARS CORONAVIRUS 2 (HOSPITAL ORDER, [place] [facility])  MRSA PCR SCREENING  CULTURE, BLOOD (ROUTINE X 2)  CULTURE, BLOOD (ROUTINE X 2)  CULTURE, RESPIRATORY  TRIGLYCERIDES  BLOOD GAS, ARTERIAL   ____________________________________________  EKG  ED ECG REPORT I, Blake Divine, the attending physician, personally viewed and  interpreted this ECG.   Date: 11/14/2018  EKG Time: 7:46  Rate: 98  Rhythm: normal sinus rhythm  Axis: Normal  Intervals:none  ST&T Change: Poor R wave progression    PROCEDURES  Procedure(s) performed (including Critical Care):  .Critical Care Performed by: Blake Divine, MD Authorized by: Blake Divine, MD   Critical care provider statement:    Critical care time (minutes):  45   Critical care time was exclusive of:  Separately billable procedures and treating other patients and teaching time   Critical care was necessary to treat or prevent imminent or life-threatening deterioration of the following conditions:  Respiratory failure   Critical care was time spent personally by me on the following activities:  Discussions with consultants, evaluation of patient's response to treatment, examination of patient, ordering and performing treatments and interventions, ordering and review of laboratory studies, ordering and review of radiographic studies, pulse oximetry, re-evaluation of patient's condition, obtaining history from patient or surrogate and review of old charts   I assumed direction of critical care for this patient from another provider in my specialty: no   Procedure Name: Intubation Date/Time: 10/31/2018 3:35 PM Performed by: Blake Divine, MD Pre-anesthesia Checklist: Patient identified, Emergency Drugs available, Suction available, Patient being monitored and Timeout performed Oxygen Delivery Method: Ambu bag Preoxygenation: Pre-oxygenation with 100% oxygen Induction Type: Rapid sequence Ventilation: Mask ventilation without difficulty Laryngoscope Size: Mac and 4 Grade View: Grade I Tube size: 7.5 mm Number of attempts: 1 Airway Equipment and Method: Video-laryngoscopy Placement Confirmation: ETT inserted through vocal cords under direct vision,  CO2 detector and Breath sounds checked- equal and bilateral Secured at: 23 cm Tube secured with: ETT holder         ____________________________________________   INITIAL IMPRESSION / ASSESSMENT AND PLAN / ED COURSE       78 year old male with history of hypertension COPD presenting with increasing weakness over the past couple of days, noted to have fallen earlier this morning and noted to be hypoxic by EMS.  He is in no respiratory distress on arrival, but is slightly tachypneic with wheezing throughout.  He is maintaining O2 sats on 4 L nasal cannula, will give steroids and breathing treatments, attempt to wean oxygen.  EKG without acute ischemic changes, low suspicion for ACS.  Will need  to assess for possible new onset CHF in conjunction with his COPD.  Will need to test for COVID-19.  Check chest x-ray for evidence of pneumonia.  Chest x-ray shows pulmonary edema versus pneumonia, will begin diuresis with Lasix, also cover for community-acquired pneumonia with Rocephin and azithromycin.  His COVID-19 testing is negative.  Labs significant for mild AKI and mild elevation in troponin, which is likely secondary to the AKI.  Patient unfortunately noted to have worsening in his mental status, now more somnolent and VBG confirms this is secondary to CO2 narcosis.  His head CT is negative for acute process.  Patient was trialed on BiPAP, but had no improvement in his mental status after 30 minutes and decision was made to intubate.  Patient was intubated without issue, chest x-ray shows appropriate positioning of ET tube, patient accepted for admission by hospitalist.      ____________________________________________   FINAL CLINICAL IMPRESSION(S) / ED DIAGNOSES  Final diagnoses:  COPD exacerbation ([place])  Congestive heart failure, unspecified HF chronicity, unspecified heart failure type ([place])  Hypoxia  Fall, initial encounter     ED Discharge Orders    None       Note:  This document was prepared using Dragon voice recognition software and may include unintentional dictation errors.    Blake Divine, MD 11/09/2018 1537

## 2018-10-26 NOTE — ED Triage Notes (Signed)
PT arrived via EMS following weakness that led to a fall in this bathroom. No injuries from the fall, fire department said O2 sats were 79% but when EMS had him on 4 L it went to 97%.

## 2018-10-26 NOTE — ED Notes (Signed)
Pt resting with eyes closed, resps even and unlabored, call bell in reach, will ctm

## 2018-10-26 NOTE — ED Notes (Signed)
Respiratory at bedside, dentures removed and placed in labeled denture cup

## 2018-10-26 NOTE — ED Notes (Addendum)
Notified MD Jess[place] that pt's O2 level was 100%. MD ordered to wean him down from 4 L to 2 L of Oxygen. Gave second nebulizer and put back on 2 L. O2 dropped to 91%. Put back on 4 L at this time. MD notified and at bedside.

## 2018-10-26 NOTE — ED Notes (Signed)
20mg  Etomidate given at 1159 am

## 2018-10-26 NOTE — Consult Note (Signed)
Name: Andrew Hall MRN: [id number] DOB: 1940/08/24     CONSULTATION DATE: 10/25/2018  REFERRING MD :  Coral ElseKlonidena  CHIEF COMPLAINT:  resp failure  HPI  78 y.o. male with a known history of COPD -anxiety comes in because fall in the bathroom and brought in here and found to have hypoxia with oxygen saturation 71% on room air -patient received multiple dose of nebulizers, steroids,  - COVID-19 test is negative -patient chest x-ray is content concerning for bibasilar pneumonia/pulmonary edema  Patient was placed on biPAP but failed And then was intubated and placed on full vent support     PAST MEDICAL HISTORY :   has no past medical history on file.  has a past surgical history that includes Hernia repair (Bilateral, 08/08/2010). Prior to Admission medications   Medication Sig Start Date End Date Taking? Authorizing Provider  albuterol (VENTOLIN HFA) 108 (90 Base) MCG/ACT inhaler Inhale 2 puffs into the lungs every 6 (six) hours as needed for wheezing or shortness of breath. 04/13/17  Yes Chrismon, Jodell Ciproennis E, PA  amLODipine (NORVASC) 5 MG tablet Take 1 tablet (5 mg total) by mouth daily. 05/23/18  Yes Chrismon, Jodell Ciproennis E, PA  B Complex Vitamins (VITAMIN B COMPLEX PO) Take by mouth daily.   Yes [provider]  clonazePAM (KLONOPIN) 0.5 MG tablet Take 1 tablet (0.5 mg total) by mouth at bedtime. 09/22/18  Yes Chrismon, Jodell Ciproennis E, PA  fenofibrate 160 MG tablet Take 1 tablet (160 mg total) by mouth daily. 08/22/18  Yes Chrismon, Jodell Ciproennis E, PA  Fexofenadine HCl (MUCINEX ALLERGY PO) Take 1 tablet by mouth daily.   Yes [provider]  Fluticasone-Umeclidin-Vilant (TRELEGY ELLIPTA) 100-62.5-25 MCG/INH AEPB Inhale 1 puff into the lungs daily. 03/30/17  Yes Chrismon, Jodell Ciproennis E, PA  MEGARED OMEGA-3 KRILL OIL PO Take 1 tablet by mouth daily.   Yes [provider]  sertraline (ZOLOFT) 50 MG tablet Take 1 tablet (50 mg total) by mouth at bedtime. 07/25/18  Yes Chrismon, Jodell Ciproennis  E, PA   No Known Allergies  FAMILY HISTORY:  family history includes Heart disease in his mother. SOCIAL HISTORY:  reports that he has quit smoking. His smoking use included cigarettes. He has never used smokeless tobacco. He reports that he does not drink alcohol or use drugs.  REVIEW OF SYSTEMS:   Unable to obtain due to critical illness   VITAL SIGNS: Temp:  [97.3 F (36.3 C)-98.6 F (37 C)] 97.3 F (36.3 C) (09/02 1400) Pulse Rate:  [67-100] 67 (09/02 1415) Resp:  [16-27] 20 (09/02 1415) BP: (81-186)/(57-97) 110/72 (09/02 1415) SpO2:  [92 %-100 %] 98 % (09/02 1415) FiO2 (%):  [60 %] 60 % (09/02 1200) Weight:  [90.7 kg] 90.7 kg (09/02 0750)   No intake/output data recorded. Total I/O In: 352.1 [I.V.:5.9; IV Piggyback:346.3] Out: -    SpO2: 98 % O2 Flow Rate (L/min): 4 L/min FiO2 (%): 60 %   Physical Examination:  GENERAL:critically ill appearing, +resp distress HEAD: Normocephalic, atraumatic.  EYES: Pupils equal, round, reactive to light.  No scleral icterus.  MOUTH: Moist mucosal membrane. NECK: Supple. No JVD.  PULMONARY: +rhonchi, +wheezing CARDIOVASCULAR: S1 and S2. Regular rate and rhythm. No murmurs, rubs, or gallops.  GASTROINTESTINAL: Soft, nontender, -distended. No masses. Positive bowel sounds. No hepatosplenomegaly.  MUSCULOSKELETAL: No swelling, clubbing, or edema.  NEUROLOGIC: obtunded SKIN:intact,warm,dry   MEDICATIONS: I have reviewed all medications and confirmed regimen as documented   CULTURE RESULTS   Recent Results (from the  past 240 hour(s))  SARS Coronavirus 2 [facility] order, Performed in [facility] hospital lab) Nasopharyngeal Nasopharyngeal Swab     Status: None   Collection Time: 11/23/2018  7:57 AM   Specimen: Nasopharyngeal Swab  Result Value Ref Range Status   SARS Coronavirus 2 NEGATIVE NEGATIVE Final    Comment: (NOTE) If result is NEGATIVE SARS-CoV-2 target nucleic acids are NOT DETECTED. The SARS-CoV-2 RNA is  generally detectable in upper and lower  respiratory specimens during the acute phase of infection. The lowest  concentration of SARS-CoV-2 viral copies this assay can detect is 250  copies / mL. A negative result does not preclude SARS-CoV-2 infection  and should not be used as the sole basis for treatment or other  patient management decisions.  A negative result may occur with  improper specimen collection / handling, submission of specimen other  than nasopharyngeal swab, presence of viral mutation(s) within the  areas targeted by this assay, and inadequate number of viral copies  (<250 copies / mL). A negative result must be combined with clinical  observations, patient history, and epidemiological information. If result is POSITIVE SARS-CoV-2 target nucleic acids are DETECTED. The SARS-CoV-2 RNA is generally detectable in upper and lower  respiratory specimens dur ing the acute phase of infection.  Positive  results are indicative of active infection with SARS-CoV-2.  Clinical  correlation with patient history and other diagnostic information is  necessary to determine patient infection status.  Positive results do  not rule out bacterial infection or co-infection with other viruses. If result is PRESUMPTIVE POSTIVE SARS-CoV-2 nucleic acids MAY BE PRESENT.   A presumptive positive result was obtained on the submitted specimen  and confirmed on repeat testing.  While 2019 novel coronavirus  (SARS-CoV-2) nucleic acids may be present in the submitted sample  additional confirmatory testing may be necessary for epidemiological  and / or clinical management purposes  to differentiate between  SARS-CoV-2 and other Sarbecovirus currently known to infect humans.  If clinically indicated additional testing with an alternate test  methodology [phone]) is advised. The SARS-CoV-2 RNA is generally  detectable in upper and lower respiratory sp ecimens during the acute  phase of  infection. The expected result is Negative. Fact Sheet for Patients:  BoilerBrush.com.cy Fact Sheet for Healthcare Providers: https://pope.com/ This test is not yet approved or cleared by the [place] FDA and has been authorized for detection and/or diagnosis of SARS-CoV-2 by FDA under an Emergency Use Authorization (EUA).  This EUA will remain in effect (meaning this test can be used) for the duration of the COVID-19 declaration under Section 564(b)(1) of the Act, 21 U.S.C. section 360bbb-3(b)(1), unless the authorization is terminated or revoked sooner. Performed at [facility], [address] [place], [place] [address]           IMAGING    Dg Abdomen 1 View  Result Date: 11/05/2018 CLINICAL DATA:  Orogastric tube placement. EXAM: ABDOMEN - 1 VIEW COMPARISON:  Portable chest obtained at the same time. FINDINGS: Endotracheal tube in satisfactory position. Orogastric tube extending into the stomach with the side hole mid to distal stomach and tip not included. See the portable chest report for the findings in the chest. Thoracic and upper lumbar spine degenerative changes. IMPRESSION: Orogastric tube extending into the stomach with the side hole mid to distal stomach and tip not included. Electronically Signed   By: Beckie Salts M.D.   On: 11/07/2018 12:38   Ct Head Wo Contrast  Result  Date: 11/13/2018 CLINICAL DATA:  78 year old male with head and neck injury following fall. Initial encounter. EXAM: CT HEAD WITHOUT CONTRAST CT CERVICAL SPINE WITHOUT CONTRAST TECHNIQUE: Multidetector CT imaging of the head and cervical spine was performed following the standard protocol without intravenous contrast. Multiplanar CT image reconstructions of the cervical spine were also generated. COMPARISON:  None. FINDINGS: CT HEAD FINDINGS Brain: No evidence of acute infarction, hemorrhage, hydrocephalus, extra-axial collection or mass  lesion/mass effect. Mild generalized cerebral volume loss noted. Vascular: No hyperdense vessel or unexpected calcification. Skull: Normal. Negative for fracture or focal lesion. Sinuses/Orbits: No acute finding. Other: None. CT CERVICAL SPINE FINDINGS Alignment: Normal. Skull base and vertebrae: No acute fracture. No primary bone lesion or focal pathologic process. Soft tissues and spinal canal: No prevertebral fluid or swelling. No visible canal hematoma. Disc levels: Moderate multilevel degenerative disc disease and facet arthropathy noted. Contributing to LEFT bony foraminal narrowing at C4-5 and biforaminal bony narrowing at C5-6. Upper chest: No acute abnormality Other: None IMPRESSION: 1. No evidence of acute intracranial abnormality. 2. No static evidence of acute injury to the cervical spine. Multilevel degenerative changes contributing to bony foraminal narrowing as discussed. Electronically Signed   By: Margarette Canada M.D.   On: 10/25/2018 09:30   Ct Cervical Spine Wo Contrast  Result Date: 11/11/2018 CLINICAL DATA:  78 year old male with head and neck injury following fall. Initial encounter. EXAM: CT HEAD WITHOUT CONTRAST CT CERVICAL SPINE WITHOUT CONTRAST TECHNIQUE: Multidetector CT imaging of the head and cervical spine was performed following the standard protocol without intravenous contrast. Multiplanar CT image reconstructions of the cervical spine were also generated. COMPARISON:  None. FINDINGS: CT HEAD FINDINGS Brain: No evidence of acute infarction, hemorrhage, hydrocephalus, extra-axial collection or mass lesion/mass effect. Mild generalized cerebral volume loss noted. Vascular: No hyperdense vessel or unexpected calcification. Skull: Normal. Negative for fracture or focal lesion. Sinuses/Orbits: No acute finding. Other: None. CT CERVICAL SPINE FINDINGS Alignment: Normal. Skull base and vertebrae: No acute fracture. No primary bone lesion or focal pathologic process. Soft tissues and spinal  canal: No prevertebral fluid or swelling. No visible canal hematoma. Disc levels: Moderate multilevel degenerative disc disease and facet arthropathy noted. Contributing to LEFT bony foraminal narrowing at C4-5 and biforaminal bony narrowing at C5-6. Upper chest: No acute abnormality Other: None IMPRESSION: 1. No evidence of acute intracranial abnormality. 2. No static evidence of acute injury to the cervical spine. Multilevel degenerative changes contributing to bony foraminal narrowing as discussed. Electronically Signed   By: Margarette Canada M.D.   On: 11/21/2018 09:30   Dg Chest Portable 1 View  Result Date: 11/22/2018 CLINICAL DATA:  Intubated. EXAM: PORTABLE CHEST 1 VIEW COMPARISON:  Earlier today. FINDINGS: Interval endotracheal tube in satisfactory position. Stable enlarged cardiac silhouette and prominent pulmonary vasculature and interstitial markings. Patchy opacity is again demonstrated in the left mid lung zone. A small amount of patchy and linear density at both lung bases demonstrates mild progression. Small left pleural effusion. Thoracic spine degenerative changes. IMPRESSION: 1. Stable patchy opacity in the left mid lung zone, suspicious for pneumonia. 2. Mildly progressive bibasilar atelectasis and possible pneumonia. 3. Small left pleural effusion. 4. Stable cardiomegaly, pulmonary vascular congestion and chronic interstitial lung disease. Electronically Signed   By: Claudie Revering M.D.   On: 10/31/2018 12:37   Dg Chest Portable 1 View  Result Date: 11/12/2018 CLINICAL DATA:  Acute shortness of breath EXAM: PORTABLE CHEST 1 VIEW COMPARISON:  04/12/2017 FINDINGS: Bilateral airspace opacities are noted, greatest  within the LEFT mid lung. Cardiomegaly is present. No pleural effusion or pneumothorax. No acute bony abnormalities are identified. IMPRESSION: Bilateral airspace opacities, LEFT-greater-than-RIGHT. Question asymmetric edema and/or infection. Electronically Signed   By: Harmon PierJeffrey  Hu M.D.    On: 2018/12/21 08:31        Indwelling Urinary Catheter continued, requirement due to   Reason to continue Indwelling Urinary Catheter strict Intake/Output monitoring for hemodynamic instability   Central Line/ continued, requirement due to  Reason to continue [employer] Line Monitoring of central venous pressure or other hemodynamic parameters and poor IV access   Ventilator continued, requirement due to severe respiratory failure   Ventilator Sedation RASS 0 to -2    CXR 11/19/2018 with left lung opacity c/w pneumonia   ASSESSMENT AND PLAN SYNOPSIS   Severe ACUTE Hypoxic and Hypercapnic Respiratory Failure from COPD exacerbation and pneumonia -continue Full MV support -continue Bronchodilator Therapy -Wean Fio2 and PEEP as tolerated   SEVERE COPD EXACERBATION -continue IV steroids as prescribed -continue NEB THERAPY as prescribed -morphine as needed -wean fio2 as needed and tolerated   ACUTE KIDNEY INJURY/Renal Failure -follow chem 7 -follow UO -continue Foley Catheter-assess need -Avoid nephrotoxic agents  NEUROLOGY - intubated and sedated - minimal sedation to achieve a RASS goal: -1   CARDIAC ICU monitoring  ID -continue IV abx as prescibed -follow up cultures  GI GI PROPHYLAXIS as indicated  NUTRITIONAL STATUS DIET-->TF's as tolerated Constipation protocol as indicated   ENDO - will use ICU hypoglycemic\Hyperglycemia protocol if needed  ELECTROLYTES -follow labs as needed -replace as needed -pharmacy consultation and following   DVT/GI PRX ordered TRANSFUSIONS AS NEEDED MONITOR FSBS ASSESS the need for LABS    Critical Care Time devoted to patient care services described in this note is 35 minutes.   Overall, patient is critically ill, prognosis is guarded.     Lucie LeatherKurian David Desteny Freeman, M.D.  [facility]  Medical Director [facility] Medical Director [facility]

## 2018-10-26 NOTE — ED Notes (Signed)
ED TO INPATIENT HANDOFF REPORT  ED Nurse Name and Phone #: Shanda Bumps [phone]   R Name/Age/Gender Andrew Hall 78 y.o. male Room/Bed: ED13A/ED13A  Code Status   Code Status: Full Code  Home/SNF/Other Home Pt intubated Is this baseline? No   Triage Complete: Triage complete  Chief Complaint fall  Triage Note PT arrived via EMS following weakness that led to a fall in this bathroom. No injuries from the fall, fire department said O2 sats were 79% but when EMS had him on 4 L it went to 97%.    Allergies No Known Allergies  Level of Care/Admitting Diagnosis ED Disposition    ED Disposition Condition Comment   Admit  Hospital Area: [facility] [[id number]]  Level of Care: ICU [6]  Covid Evaluation: Confirmed COVID Negative  Diagnosis: Acute respiratory failure (HCC) [518.81.ICD-9-CM]  Admitting Physician: Katha Hamming [[id number]]  Attending Physician: Katha Hamming [[id number]]  Estimated length of stay: past midnight tomorrow  Certification:: I certify this patient will need inpatient services for at least 2 midnights  PT Class (Do Not Modify): Inpatient [101]  PT Acc Code (Do Not Modify): Private [1]       B Medical/Surgery History History reviewed. No pertinent past medical history. Past Surgical History:  Procedure Laterality Date  . HERNIA REPAIR Bilateral 08/08/2010     A IV Location/Drains/Wounds Patient Lines/Drains/Airways Status   Active Line/Drains/Airways    Name:   Placement date:   Placement time:   Site:   Days:   Peripheral IV 11/11/2018 Right Arm   11/18/2018    0756    Arm   less than 1   Peripheral IV 10/25/2018 Right Antecubital   11/17/2018    1208    Antecubital   less than 1   Peripheral IV 11/12/2018 Left Antecubital   10/31/2018    1209    Antecubital   less than 1   NG/OG Tube Orogastric 18 Fr. Right mouth Xray Documented cm marking at nare/ corner of mouth   10/31/2018    1229    Right mouth   less than 1   Urethral Catheter  melody Double-lumen 16 Fr.   11/04/2018    1216    Double-lumen   less than 1   Airway 7.5 mm   11/04/2018    1200     less than 1          Intake/Output Last 24 hours No intake or output data in the 24 hours ending 11/05/2018 1242  Labs/Imaging Results for orders placed or performed during the hospital encounter of 11/09/2018 (from the past 48 hour(s))  Basic metabolic panel     Status: Abnormal   Collection Time: 10/30/2018  7:57 AM  Result Value Ref Range   Sodium 139 135 - 145 mmol/L   Potassium 5.3 (H) 3.5 - 5.1 mmol/L   Chloride 94 (L) 98 - 111 mmol/L   CO2 35 (H) 22 - 32 mmol/L   Glucose, Bld 142 (H) 70 - 99 mg/dL   BUN 46 (H) 8 - 23 mg/dL   Creatinine, Ser 4.78 (H) 0.61 - 1.24 mg/dL   Calcium 9.2 8.9 - 29.5 mg/dL   GFR calc non Af Amer 42 (L) >60 mL/min   GFR calc Af Amer 49 (L) >60 mL/min   Anion gap 10 5 - 15    Comment: Performed at [facility], [address], [place], [place] [address]  Brain natriuretic peptide     Status:  Abnormal   Collection Time: 06-Nov-2018  7:57 AM  Result Value Ref Range   B Natriuretic Peptide 344.0 (H) 0.0 - 100.0 pg/mL    Comment: Performed at [facility], [place], [place] [address]  Troponin I (High Sensitivity)     Status: Abnormal   Collection Time: November 06, 2018  7:57 AM  Result Value Ref Range   Troponin I (High Sensitivity) 40 (H) <18 ng/L    Comment: (NOTE) Elevated high sensitivity troponin I (hsTnI) values and significant  changes across serial measurements may suggest ACS but many other  chronic and acute conditions are known to elevate hsTnI results.  Refer to the "Links" section for chest pain algorithms and additional  guidance. Performed at [facility], [address] [place], [place] [address]   CBC with Differential     Status: Abnormal   Collection Time: 2018-11-06  7:57 AM  Result Value Ref Range   WBC 11.4 (H) 4.0 - 10.5 K/uL   RBC 5.32 4.22 - 5.81 MIL/uL   Hemoglobin 14.6 13.0  - 17.0 g/dL   HCT 50.2 39.0 - 52.0 %   MCV 94.4 80.0 - 100.0 fL   MCH 27.4 26.0 - 34.0 pg   MCHC 29.1 (L) 30.0 - 36.0 g/dL   RDW 16.4 (H) 11.5 - 15.5 %   Platelets 450 (H) 150 - 400 K/uL   nRBC 0.9 (H) 0.0 - 0.2 %   Neutrophils Relative % 81 %   Neutro Abs 9.3 (H) 1.7 - 7.7 K/uL   Lymphocytes Relative 6 %   Lymphs Abs 0.7 0.7 - 4.0 K/uL   Monocytes Relative 11 %   Monocytes Absolute 1.2 (H) 0.1 - 1.0 K/uL   Eosinophils Relative 0 %   Eosinophils Absolute 0.0 0.0 - 0.5 K/uL   Basophils Relative 0 %   Basophils Absolute 0.1 0.0 - 0.1 K/uL   Immature Granulocytes 2 %   Abs Immature Granulocytes 0.22 (H) 0.00 - 0.07 K/uL    Comment: Performed at [facility], [address] [place], [place] [address]  SARS Coronavirus 2 [facility] order, Performed in [facility] hospital lab) Nasopharyngeal Nasopharyngeal Swab     Status: None   Collection Time: 06-Nov-2018  7:57 AM   Specimen: Nasopharyngeal Swab  Result Value Ref Range   SARS Coronavirus 2 NEGATIVE NEGATIVE    Comment: (NOTE) If result is NEGATIVE SARS-CoV-2 target nucleic acids are NOT DETECTED. The SARS-CoV-2 RNA is generally detectable in upper and lower  respiratory specimens during the acute phase of infection. The lowest  concentration of SARS-CoV-2 viral copies this assay can detect is 250  copies / mL. A negative result does not preclude SARS-CoV-2 infection  and should not be used as the sole basis for treatment or other  patient management decisions.  A negative result may occur with  improper specimen collection / handling, submission of specimen other  than nasopharyngeal swab, presence of viral mutation(s) within the  areas targeted by this assay, and inadequate number of viral copies  (<250 copies / mL). A negative result must be combined with clinical  observations, patient history, and epidemiological information. If result is POSITIVE SARS-CoV-2 target nucleic acids are DETECTED. The SARS-CoV-2 RNA is  generally detectable in upper and lower  respiratory specimens dur ing the acute phase of infection.  Positive  results are indicative of active infection with SARS-CoV-2.  Clinical  correlation with patient history and other diagnostic information is  necessary to determine  patient infection status.  Positive results do  not rule out bacterial infection or co-infection with other viruses. If result is PRESUMPTIVE POSTIVE SARS-CoV-2 nucleic acids MAY BE PRESENT.   A presumptive positive result was obtained on the submitted specimen  and confirmed on repeat testing.  While 2019 novel coronavirus  (SARS-CoV-2) nucleic acids may be present in the submitted sample  additional confirmatory testing may be necessary for epidemiological  and / or clinical management purposes  to differentiate between  SARS-CoV-2 and other Sarbecovirus currently known to infect humans.  If clinically indicated additional testing with an alternate test  methodology [phone]([id number]) is advised. The SARS-CoV-2 RNA is generally  detectable in upper and lower respiratory sp ecimens during the acute  phase of infection. The expected result is Negative. Fact Sheet for Patients:  BoilerBrush.com.cyhttps://www.fda.gov/media/136312/download Fact Sheet for Healthcare Providers: https://pope.com/https://www.fda.gov/media/136313/download This test is not yet approved or cleared by the [place] FDA and has been authorized for detection and/or diagnosis of SARS-CoV-2 by FDA under an Emergency Use Authorization (EUA).  This EUA will remain in effect (meaning this test can be used) for the duration of the COVID-19 declaration under Section 564(b)(1) of the Act, 21 U.S.C. section 360bbb-3(b)(1), unless the authorization is terminated or revoked sooner. Performed at [facility], [address] [place], [place] [address]   Troponin I (High Sensitivity)     Status: Abnormal   Collection Time: 10/29/2018 10:00 AM  Result Value Ref Range   Troponin I  (High Sensitivity) 52 (H) <18 ng/L    Comment: (NOTE) Elevated high sensitivity troponin I (hsTnI) values and significant  changes across serial measurements may suggest ACS but many other  chronic and acute conditions are known to elevate hsTnI results.  Refer to the "Links" section for chest pain algorithms and additional  guidance. Performed at [facility], [address] [place], [place] [address]   Blood gas, venous     Status: Abnormal   Collection Time: 10/25/2018 10:21 AM  Result Value Ref Range   pH, Ven 7.14 (LL) 7.250 - 7.430    Comment: CRITICAL RESULT CALLED TO, READ BACK BY AND VERIFIED WITH: KRISTY BRAND RN ON [id number]/15/2020 @1108  HH    pCO2, Ven >120.0 (HH) 44.0 - 60.0 mmHg    Comment: CRITICAL RESULT CALLED TO, READ BACK BY AND VERIFIED WITH: Edythe ClarityKRISTIE BRAND RN ON [id number]/28/2020 @1108  HH    pO2, Ven 46.0 (H) 32.0 - 45.0 mmHg   Bicarbonate 42.6 (H) 20.0 - 28.0 mmol/L   Acid-Base Excess 8.7 (H) 0.0 - 2.0 mmol/L   O2 Saturation 67.0 %   Patient temperature 37.0    Collection site VEIN    Sample type VEIN     Comment: Performed at [facility], [address] [place], [place] [address]   Dg Abdomen 1 View  Result Date: 10/25/2018 CLINICAL DATA:  Orogastric tube placement. EXAM: ABDOMEN - 1 VIEW COMPARISON:  Portable chest obtained at the same time. FINDINGS: Endotracheal tube in satisfactory position. Orogastric tube extending into the stomach with the side hole mid to distal stomach and tip not included. See the portable chest report for the findings in the chest. Thoracic and upper lumbar spine degenerative changes. IMPRESSION: Orogastric tube extending into the stomach with the side hole mid to distal stomach and tip not included. Electronically Signed   By: Beckie SaltsSteven  Reid M.D.   On: 10/25/2018 12:38   Ct Head Wo Contrast  Result Date: 11/09/2018 CLINICAL DATA:  78 year old male with  head and neck injury following fall. Initial encounter. EXAM: CT HEAD WITHOUT  CONTRAST CT CERVICAL SPINE WITHOUT CONTRAST TECHNIQUE: Multidetector CT imaging of the head and cervical spine was performed following the standard protocol without intravenous contrast. Multiplanar CT image reconstructions of the cervical spine were also generated. COMPARISON:  None. FINDINGS: CT HEAD FINDINGS Brain: No evidence of acute infarction, hemorrhage, hydrocephalus, extra-axial collection or mass lesion/mass effect. Mild generalized cerebral volume loss noted. Vascular: No hyperdense vessel or unexpected calcification. Skull: Normal. Negative for fracture or focal lesion. Sinuses/Orbits: No acute finding. Other: None. CT CERVICAL SPINE FINDINGS Alignment: Normal. Skull base and vertebrae: No acute fracture. No primary bone lesion or focal pathologic process. Soft tissues and spinal canal: No prevertebral fluid or swelling. No visible canal hematoma. Disc levels: Moderate multilevel degenerative disc disease and facet arthropathy noted. Contributing to LEFT bony foraminal narrowing at C4-5 and biforaminal bony narrowing at C5-6. Upper chest: No acute abnormality Other: None IMPRESSION: 1. No evidence of acute intracranial abnormality. 2. No static evidence of acute injury to the cervical spine. Multilevel degenerative changes contributing to bony foraminal narrowing as discussed. Electronically Signed   By: Harmon Pier M.D.   On: 2018-10-28 09:30   Ct Cervical Spine Wo Contrast  Result Date: 10/28/18 CLINICAL DATA:  78 year old male with head and neck injury following fall. Initial encounter. EXAM: CT HEAD WITHOUT CONTRAST CT CERVICAL SPINE WITHOUT CONTRAST TECHNIQUE: Multidetector CT imaging of the head and cervical spine was performed following the standard protocol without intravenous contrast. Multiplanar CT image reconstructions of the cervical spine were also generated. COMPARISON:  None. FINDINGS: CT HEAD FINDINGS Brain: No evidence of acute infarction, hemorrhage, hydrocephalus, extra-axial  collection or mass lesion/mass effect. Mild generalized cerebral volume loss noted. Vascular: No hyperdense vessel or unexpected calcification. Skull: Normal. Negative for fracture or focal lesion. Sinuses/Orbits: No acute finding. Other: None. CT CERVICAL SPINE FINDINGS Alignment: Normal. Skull base and vertebrae: No acute fracture. No primary bone lesion or focal pathologic process. Soft tissues and spinal canal: No prevertebral fluid or swelling. No visible canal hematoma. Disc levels: Moderate multilevel degenerative disc disease and facet arthropathy noted. Contributing to LEFT bony foraminal narrowing at C4-5 and biforaminal bony narrowing at C5-6. Upper chest: No acute abnormality Other: None IMPRESSION: 1. No evidence of acute intracranial abnormality. 2. No static evidence of acute injury to the cervical spine. Multilevel degenerative changes contributing to bony foraminal narrowing as discussed. Electronically Signed   By: Harmon Pier M.D.   On: 2018/10/28 09:30   Dg Chest Portable 1 View  Result Date: Oct 28, 2018 CLINICAL DATA:  Intubated. EXAM: PORTABLE CHEST 1 VIEW COMPARISON:  Earlier today. FINDINGS: Interval endotracheal tube in satisfactory position. Stable enlarged cardiac silhouette and prominent pulmonary vasculature and interstitial markings. Patchy opacity is again demonstrated in the left mid lung zone. A small amount of patchy and linear density at both lung bases demonstrates mild progression. Small left pleural effusion. Thoracic spine degenerative changes. IMPRESSION: 1. Stable patchy opacity in the left mid lung zone, suspicious for pneumonia. 2. Mildly progressive bibasilar atelectasis and possible pneumonia. 3. Small left pleural effusion. 4. Stable cardiomegaly, pulmonary vascular congestion and chronic interstitial lung disease. Electronically Signed   By: Beckie Salts M.D.   On: 10-28-18 12:37   Dg Chest Portable 1 View  Result Date: Oct 28, 2018 CLINICAL DATA:  Acute shortness  of breath EXAM: PORTABLE CHEST 1 VIEW COMPARISON:  04/12/2017 FINDINGS: Bilateral airspace opacities are noted, greatest within the LEFT mid lung. Cardiomegaly is  present. No pleural effusion or pneumothorax. No acute bony abnormalities are identified. IMPRESSION: Bilateral airspace opacities, LEFT-greater-than-RIGHT. Question asymmetric edema and/or infection. Electronically Signed   By: Harmon PierJeffrey  Hu M.D.   On: 2018-08-22 08:31    Pending Labs Unresulted Labs (From admission, onward)    Start     Ordered   11/02/18 0500  Creatinine, serum  (enoxaparin (LOVENOX)    CrCl >/= 30 ml/min)  Weekly,   STAT    Comments: while on enoxaparin therapy    2018-03-16 1028   10/27/18 0500  Basic metabolic panel  Tomorrow morning,   STAT     2018-03-16 1028   10/27/18 0500  CBC  Tomorrow morning,   STAT     2018-03-16 1028   2018-03-16 1207  Triglycerides  (propofol (DIPRIVAN))  Every 72 hours,   STAT    Comments: While on propofol (DIPRIVAN)    2018-03-16 1206   2018-03-16 1016  Culture, blood (routine x 2)  BLOOD CULTURE X 2,   STAT     2018-03-16 1015          Vitals/Pain Today's Vitals   2018-03-16 1130 2018-03-16 1145 2018-03-16 1200 2018-03-16 1215  BP: (!) 151/84  126/80   Pulse: 83 89 94 100  Resp: (!) 21 18 20 20   Temp:      TempSrc:      SpO2: 93% 92% 100% 96%  Weight:      Height:      PainSc:        Isolation Precautions No active isolations  Medications Medications  azithromycin (ZITHROMAX) 500 mg in sodium chloride 0.9 % 250 mL IVPB (500 mg Intravenous New Bag/Given 10/30/2018 1143)  amLODipine (NORVASC) tablet 5 mg (has no administration in time range)  sertraline (ZOLOFT) tablet 50 mg (has no administration in time range)  clonazePAM (KLONOPIN) tablet 0.5 mg (has no administration in time range)  fexofenadine (ALLEGRA) tablet 180 mg (has no administration in time range)  acetaminophen (TYLENOL) tablet 650 mg (has no administration in time range)    Or  acetaminophen (TYLENOL) suppository 650  mg (has no administration in time range)  traZODone (DESYREL) tablet 25 mg (has no administration in time range)  docusate sodium (COLACE) capsule 100 mg (has no administration in time range)  bisacodyl (DULCOLAX) EC tablet 5 mg (has no administration in time range)  ondansetron (ZOFRAN) tablet 4 mg (has no administration in time range)    Or  ondansetron (ZOFRAN) injection 4 mg (has no administration in time range)  enoxaparin (LOVENOX) injection 40 mg (has no administration in time range)  cefTRIAXone (ROCEPHIN) 1 g in sodium chloride 0.9 % 100 mL IVPB (has no administration in time range)  azithromycin (ZITHROMAX) 250 mg in dextrose 5 % 125 mL IVPB (has no administration in time range)  ipratropium-albuterol (DUONEB) 0.5-2.5 (3) MG/3ML nebulizer solution 3 mL (3 mLs Nebulization Not Given 11/06/2018 1200)  methylPREDNISolone sodium succinate (SOLU-MEDROL) 125 mg/2 mL injection 60 mg (has no administration in time range)  furosemide (LASIX) injection 20 mg (has no administration in time range)  fluticasone furoate-vilanterol (BREO ELLIPTA) 100-25 MCG/INH 1 puff (has no administration in time range)    And  umeclidinium bromide (INCRUSE ELLIPTA) 62.5 MCG/INH 1 puff (has no administration in time range)  fentaNYL (SUBLIMAZE) injection 50 mcg (has no administration in time range)  fentaNYL (SUBLIMAZE) injection 50 mcg (has no administration in time range)  propofol (DIPRIVAN) 1000 MG/100ML infusion (5 mcg/kg/min  90.7 kg Intravenous New Bag/Given  11/14/2018 1241)  ipratropium-albuterol (DUONEB) 0.5-2.5 (3) MG/3ML nebulizer solution 3 mL (3 mLs Nebulization Given 11/22/2018 1030)  methylPREDNISolone sodium succinate (SOLU-MEDROL) 125 mg/2 mL injection 125 mg (125 mg Intravenous Given 11/10/2018 0816)  furosemide (LASIX) injection 40 mg (40 mg Intravenous Given 11/11/2018 1002)  cefTRIAXone (ROCEPHIN) 1 g in sodium chloride 0.9 % 100 mL IVPB (0 g Intravenous Stopped 11/23/2018 1142)    Mobility non-ambulatory High  fall risk   Focused Assessments 1   R Recommendations: See Admitting Provider Note  Report given to:   Additional Notes:

## 2018-10-26 NOTE — ED Notes (Addendum)
NG attempted by Mateo Flow with no success

## 2018-10-26 NOTE — ED Notes (Signed)
Daughter at bedside.

## 2018-10-27 ENCOUNTER — Inpatient Hospital Stay
Admit: 2018-10-27 | Discharge: 2018-10-27 | Disposition: A | Discharge status: Home / Self Care | Payer: Medicare PPO | Attending: Internal Medicine | Admitting: Internal Medicine

## 2018-10-27 LAB — BASIC METABOLIC PANEL
Anion gap: 13 (ref 5–15)
BUN: 50 mg/dL — ABNORMAL HIGH (ref 8–23)
CO2: 33 mmol/L — ABNORMAL HIGH (ref 22–32)
Calcium: 8.5 mg/dL — ABNORMAL LOW (ref 8.9–10.3)
Chloride: 96 mmol/L — ABNORMAL LOW (ref 98–111)
Creatinine, Ser: 1.53 mg/dL — ABNORMAL HIGH (ref 0.61–1.24)
GFR calc Af Amer: 50 mL/min — ABNORMAL LOW (ref >60)
GFR calc non Af Amer: 43 mL/min — ABNORMAL LOW (ref >60)
Glucose, Bld: 96 mg/dL (ref 70–99)
Potassium: 4.8 mmol/L (ref 3.5–5.1)
Sodium: 142 mmol/L (ref 135–145)

## 2018-10-27 LAB — CBC
HCT: 45.1 % (ref 39.0–52.0)
Hemoglobin: 13.6 g/dL (ref 13.0–17.0)
MCH: 26.9 pg (ref 26.0–34.0)
MCHC: 30.2 g/dL (ref 30.0–36.0)
MCV: 89.3 fL (ref 80.0–100.0)
Platelets: 359 10*3/uL (ref 150–400)
RBC: 5.05 MIL/uL (ref 4.22–5.81)
RDW: 16.2 % — ABNORMAL HIGH (ref 11.5–15.5)
WBC: 12.4 10*3/uL — ABNORMAL HIGH (ref 4.0–10.5)
nRBC: 0.7 % — ABNORMAL HIGH (ref 0.0–0.2)

## 2018-10-27 LAB — URINALYSIS, COMPLETE (UACMP) WITH MICROSCOPIC
Bilirubin Urine: NEGATIVE
Glucose, UA: NEGATIVE mg/dL
Ketones, ur: NEGATIVE mg/dL
Nitrite: NEGATIVE
Protein, ur: NEGATIVE mg/dL
Specific Gravity, Urine: 1.025 (ref 1.005–1.030)
pH: 5 (ref 5.0–8.0)

## 2018-10-27 LAB — GLUCOSE, CAPILLARY: Glucose-Capillary: 103 mg/dL — ABNORMAL HIGH (ref 70–99)

## 2018-10-27 LAB — BLOOD GAS, ARTERIAL
Acid-Base Excess: 16.4 mmol/L — ABNORMAL HIGH (ref 0.0–2.0)
Bicarbonate: 43.4 mmol/L — ABNORMAL HIGH (ref 20.0–28.0)
FIO2: 0.5
MECHVT: 500 mL
Mechanical Rate: 20
O2 Saturation: 96.1 %
PEEP: 5 cmH2O
Patient temperature: 37
pCO2 arterial: 61 mmHg — ABNORMAL HIGH (ref 32.0–48.0)
pH, Arterial: 7.46 — ABNORMAL HIGH (ref 7.350–7.450)
pO2, Arterial: 78 mmHg — ABNORMAL LOW (ref 83.0–108.0)

## 2018-10-27 MED ORDER — ALPRAZOLAM 0.5 MG PO TABS
0.5000 mg | ORAL_TABLET | Freq: Three times a day (TID) | ORAL | Status: DC | PRN
  Administered 2018-10-27: 16:00:00 0.5 mg via ORAL
  Filled 2018-10-27: qty 1

## 2018-10-27 MED ORDER — ORAL CARE MOUTH RINSE
15.0000 mL | Freq: Two times a day (BID) | OROMUCOSAL | Status: DC
  Administered 2018-10-27 – 2018-10-31 (×5): 15 mL via OROMUCOSAL

## 2018-10-27 MED ORDER — FUROSEMIDE 10 MG/ML IJ SOLN
20.0000 mg | Freq: Once | INTRAMUSCULAR | Status: AC
  Administered 2018-10-27: 15:00:00 20 mg via INTRAVENOUS

## 2018-10-27 MED ORDER — MORPHINE SULFATE (PF) 2 MG/ML IV SOLN
2.0000 mg | INTRAVENOUS | Status: AC
  Administered 2018-10-27: 15:00:00 2 mg via INTRAVENOUS

## 2018-10-27 MED ORDER — MORPHINE SULFATE (PF) 2 MG/ML IV SOLN
INTRAVENOUS | Status: AC
  Filled 2018-10-27: qty 1

## 2018-10-27 MED ORDER — PANTOPRAZOLE SODIUM 40 MG IV SOLR
40.0000 mg | INTRAVENOUS | Status: DC
  Administered 2018-10-27: 14:00:00 40 mg via INTRAVENOUS
  Filled 2018-10-27: qty 40

## 2018-10-27 NOTE — Progress Notes (Signed)
Patient doing well with vent wean.  Order obtained for extubation.

## 2018-10-27 NOTE — Progress Notes (Signed)
*  PRELIMINARY RESULTS* Echocardiogram 2D Echocardiogram has been performed.  Andrew Hall 10/27/2018, 8:59 AM

## 2018-10-27 NOTE — Progress Notes (Signed)
Andrew Hall with cardiopulmomary at bedside starting vent wean.

## 2018-10-27 NOTE — Progress Notes (Signed)
Pt extubated, placed on 4 lpm [place] initially, sats would not stay above 88% and placed on 15l high flow cannula, patient in no respiratory distress, able to talk in full sentences. sats 96%. Will continue to wean O2.

## 2018-10-27 NOTE — Progress Notes (Signed)
Sedation turned off for vent wean.

## 2018-10-27 NOTE — Progress Notes (Signed)
Patient awake and following commands.

## 2018-10-27 NOTE — Progress Notes (Signed)
Patient with 02 SATs 77-82% on room air.  Dr. Mortimer Fries notified and patient changed to high flow nasal cannula.  Order obtained for lasix 20 mg IV to be given.

## 2018-10-27 NOTE — Progress Notes (Signed)
Patient anxious and saying he can't breathing well.  Order received for morphine IV.

## 2018-10-27 NOTE — Progress Notes (Signed)
[place] at [facility]   PATIENT NAME: Andrew Hall    MR#:  [id number]  DATE OF BIRTH:  October 04, 1940  SUBJECTIVE: 78 year old male admitted yesterday for acute respiratory failure with hypercapnia, intubated in the emergency room, admitted to ICU.  Seen this morning, on full vent support.  Still critically ill.  CHIEF COMPLAINT:   Chief Complaint  Patient presents with  . Weakness  . Fall    REVIEW OF SYSTEMS:   ROS  unable to obtain review of systems because patient is intubated and sedated. DRUG ALLERGIES:  No Known Allergies  VITALS:  Blood pressure (!) 114/57, pulse 77, temperature 98.8 F (37.1 C), resp. rate 20, height 6' (1.829 m), weight 90.7 kg, SpO2 97 %.  PHYSICAL EXAMINATION:  GENERAL:  78 y.o.-year-old patient lying in the bed critically ill, intubated, sedated. EYES: Pupils equal, round, reactive to light No scleral icterus.  HEENT: Head atraumatic, normocephalic.  Orally intubated. NECK:   no jugular venous distention. No thyroid enlargement, no tenderness.  LUNGS: Diminished breath sound bilaterally.    CARDIOVASCULAR: S1, S2 normal. No murmurs, rubs, or gallops.  ABDOMEN: Soft, , nondistended. Bowel sounds present. No organomegaly or mass.  EXTREMITIES: No pedal edema, cyanosis, or clubbing.  NEUROLOGIC: Currently intubated yesterday and sedated.    PSYCHIATRIC: Intubated sedated.  SKIN: No obvious rash, lesion, or ulcer.    LABORATORY PANEL:   CBC Recent Labs  Lab 10/27/18 0404  WBC 12.4*  HGB 13.6  HCT 45.1  PLT 359   ------------------------------------------------------------------------------------------------------------------  Chemistries  Recent Labs  Lab 10/27/18 0404  NA 142  K 4.8  CL 96*  CO2 33*  GLUCOSE 96  BUN 50*  CREATININE 1.53*  CALCIUM 8.5*   ------------------------------------------------------------------------------------------------------------------  Cardiac  Enzymes No results for input(s): TROPONINI in the last 168 hours. ------------------------------------------------------------------------------------------------------------------  RADIOLOGY:  Dg Abdomen 1 View  Result Date: 11-Nov-2018 CLINICAL DATA:  Orogastric tube placement. EXAM: ABDOMEN - 1 VIEW COMPARISON:  Portable chest obtained at the same time. FINDINGS: Endotracheal tube in satisfactory position. Orogastric tube extending into the stomach with the side hole mid to distal stomach and tip not included. See the portable chest report for the findings in the chest. Thoracic and upper lumbar spine degenerative changes. IMPRESSION: Orogastric tube extending into the stomach with the side hole mid to distal stomach and tip not included. Electronically Signed   By: Claudie Revering M.D.   On: 11/11/18 12:38   Ct Head Wo Contrast  Result Date: 2018-11-11 CLINICAL DATA:  78 year old male with head and neck injury following fall. Initial encounter. EXAM: CT HEAD WITHOUT CONTRAST CT CERVICAL SPINE WITHOUT CONTRAST TECHNIQUE: Multidetector CT imaging of the head and cervical spine was performed following the standard protocol without intravenous contrast. Multiplanar CT image reconstructions of the cervical spine were also generated. COMPARISON:  None. FINDINGS: CT HEAD FINDINGS Brain: No evidence of acute infarction, hemorrhage, hydrocephalus, extra-axial collection or mass lesion/mass effect. Mild generalized cerebral volume loss noted. Vascular: No hyperdense vessel or unexpected calcification. Skull: Normal. Negative for fracture or focal lesion. Sinuses/Orbits: No acute finding. Other: None. CT CERVICAL SPINE FINDINGS Alignment: Normal. Skull base and vertebrae: No acute fracture. No primary bone lesion or focal pathologic process. Soft tissues and spinal canal: No prevertebral fluid or swelling. No visible canal hematoma. Disc levels: Moderate multilevel degenerative disc disease and facet arthropathy  noted. Contributing to LEFT bony foraminal narrowing at C4-5 and biforaminal bony narrowing at C5-6. Upper chest: No acute  abnormality Other: None IMPRESSION: 1. No evidence of acute intracranial abnormality. 2. No static evidence of acute injury to the cervical spine. Multilevel degenerative changes contributing to bony foraminal narrowing as discussed. Electronically Signed   By: Harmon Pier M.D.   On: November 25, 2018 09:30   Ct Cervical Spine Wo Contrast  Result Date: November 25, 2018 CLINICAL DATA:  78 year old male with head and neck injury following fall. Initial encounter. EXAM: CT HEAD WITHOUT CONTRAST CT CERVICAL SPINE WITHOUT CONTRAST TECHNIQUE: Multidetector CT imaging of the head and cervical spine was performed following the standard protocol without intravenous contrast. Multiplanar CT image reconstructions of the cervical spine were also generated. COMPARISON:  None. FINDINGS: CT HEAD FINDINGS Brain: No evidence of acute infarction, hemorrhage, hydrocephalus, extra-axial collection or mass lesion/mass effect. Mild generalized cerebral volume loss noted. Vascular: No hyperdense vessel or unexpected calcification. Skull: Normal. Negative for fracture or focal lesion. Sinuses/Orbits: No acute finding. Other: None. CT CERVICAL SPINE FINDINGS Alignment: Normal. Skull base and vertebrae: No acute fracture. No primary bone lesion or focal pathologic process. Soft tissues and spinal canal: No prevertebral fluid or swelling. No visible canal hematoma. Disc levels: Moderate multilevel degenerative disc disease and facet arthropathy noted. Contributing to LEFT bony foraminal narrowing at C4-5 and biforaminal bony narrowing at C5-6. Upper chest: No acute abnormality Other: None IMPRESSION: 1. No evidence of acute intracranial abnormality. 2. No static evidence of acute injury to the cervical spine. Multilevel degenerative changes contributing to bony foraminal narrowing as discussed. Electronically Signed   By: Harmon Pier M.D.   On: Nov 25, 2018 09:30   Dg Chest Portable 1 View  Result Date: 11/25/18 CLINICAL DATA:  Intubated. EXAM: PORTABLE CHEST 1 VIEW COMPARISON:  Earlier today. FINDINGS: Interval endotracheal tube in satisfactory position. Stable enlarged cardiac silhouette and prominent pulmonary vasculature and interstitial markings. Patchy opacity is again demonstrated in the left mid lung zone. A small amount of patchy and linear density at both lung bases demonstrates mild progression. Small left pleural effusion. Thoracic spine degenerative changes. IMPRESSION: 1. Stable patchy opacity in the left mid lung zone, suspicious for pneumonia. 2. Mildly progressive bibasilar atelectasis and possible pneumonia. 3. Small left pleural effusion. 4. Stable cardiomegaly, pulmonary vascular congestion and chronic interstitial lung disease. Electronically Signed   By: Beckie Salts M.D.   On: 25-Nov-2018 12:37   Dg Chest Portable 1 View  Result Date: November 25, 2018 CLINICAL DATA:  Acute shortness of breath EXAM: PORTABLE CHEST 1 VIEW COMPARISON:  04/12/2017 FINDINGS: Bilateral airspace opacities are noted, greatest within the LEFT mid lung. Cardiomegaly is present. No pleural effusion or pneumothorax. No acute bony abnormalities are identified. IMPRESSION: Bilateral airspace opacities, LEFT-greater-than-RIGHT. Question asymmetric edema and/or infection. Electronically Signed   By: Harmon Pier M.D.   On: 2018-11-25 08:31   Dg Abd Portable 1v  Result Date: 25-Nov-2018 CLINICAL DATA:  Encounter for OG tube placement. EXAM: PORTABLE ABDOMEN - 1 VIEW COMPARISON:  None. FINDINGS: NG tube extends the stomach. Side port beneath the GE junction. LEFT lower lobe airspace disease. IMPRESSION: NG tube in stomach. Electronically Signed   By: Genevive Bi M.D.   On: 11/25/18 15:18    EKG:   Orders placed or performed during the hospital encounter of 2018-11-25  . EKG 12-Lead  . EKG 12-Lead    ASSESSMENT AND PLAN:  #1. severe acute  respiratory failure with hypoxia, hypercapnia secondary to COPD exacerbation, pneumonia, continue full vent support along with bronchodilators, IV antibiotics, weaning trials as tolerated.  #3. acute kidney injury,  monitor closely. 4..   UTI, patient already on antibiotics follow urine cultures.   already is on Rocephin. 5. acute respiratory failure due to combination of CHF and the patient, pneumonia, echocardiogram is done, results are pending. #6. condition guarded,  DVT, GI prophylaxis.  All the records are reviewed and case discussed with Care Management/Social Workerr. Management plans discussed with the patient, family and they are in agreement.  CODE STATUS: Full code  TOTAL TIME TAKING CARE OF THIS PATIENT: 35 minutes.   POSSIBLE D/C IN 1-2 DAYS, DEPENDING ON CLINICAL CONDITION.   Katha HammingSnehalatha Chantry Headen M.D on 10/27/2018 at 1:13 PM  Between 7am to 6pm - Pager - [phone]  After 6pm go to [email or web address] - password [email or web address] [facility]  [facility]  Office  [phone]  CC: Primary care physician; Tamsen Roershrismon, Dennis E, PA   Note: This dictation was prepared with Dragon dictation along with smaller phrase technology. Any transcriptional errors that result from this process are unintentional.

## 2018-10-27 NOTE — Progress Notes (Signed)
Patient extubated and placed on 4L nasal cannula.  Patient alert and able to cough and take deep breath.  Patient stated when extubated "I can't hear good.  I need hearing aids."

## 2018-10-27 NOTE — Progress Notes (Signed)
CRITICAL CARE NOTE  CC  follow up respiratory failure  SUBJECTIVE Patient remains critically ill Prognosis is guarded   BP 114/66   Pulse 71   Temp 98.3 F (36.8 C) (Axillary)   Resp 20   Ht 6' (1.829 m)   Wt 90.7 kg   SpO2 100%   BMI 27.12 kg/m    I/O last 3 completed shifts: In: 974.5 [I.V.:628.3; IV Piggyback:346.3] Out: 6270 [Urine:1025; Emesis/NG output:210] No intake/output data recorded.  SpO2: 100 % O2 Flow Rate (L/min): 50 L/min FiO2 (%): 50 %  Vent Mode: PRVC FiO2 (%):  [0.5 %-60 %] 50 % Set Rate:  [20 bmp] 20 bmp Vt Set:  [500 mL] 500 mL PEEP:  [5 cmH20] 5 cmH20 Plateau Pressure:  [15 cmH20-17 cmH20] 15 cmH20 CBC    Component Value Date/Time   WBC 12.4 (H) 10/27/2018 0404   RBC 5.05 10/27/2018 0404   HGB 13.6 10/27/2018 0404   HGB 15.4 03/10/2018 1202   HCT 45.1 10/27/2018 0404   HCT 45.7 03/10/2018 1202   PLT 359 10/27/2018 0404   PLT 350 03/10/2018 1202   MCV 89.3 10/27/2018 0404   MCV 90 03/10/2018 1202   MCV 101 (H) 12/20/2013 1542   MCH 26.9 10/27/2018 0404   MCHC 30.2 10/27/2018 0404   RDW 16.2 (H) 10/27/2018 0404   RDW 13.1 03/10/2018 1202   RDW 13.8 12/20/2013 1542   LYMPHSABS 0.7 11/10/2018 0757   LYMPHSABS 2.3 03/10/2018 1202   LYMPHSABS 1.5 12/20/2013 1542   MONOABS 1.2 (H) 10/28/2018 0757   MONOABS 0.6 12/20/2013 1542   EOSABS 0.0 11/21/2018 0757   EOSABS 0.3 03/10/2018 1202   EOSABS 0.3 12/20/2013 1542   BASOSABS 0.1 11/22/2018 0757   BASOSABS 0.1 03/10/2018 1202   BASOSABS 0.0 12/20/2013 1542   BMP Latest Ref Rng & Units 10/27/2018 11/03/2018 03/10/2018  Glucose 70 - 99 mg/dL 96 142(H) 100(H)  BUN 8 - 23 mg/dL 50(H) 46(H) 16  Creatinine 0.61 - 1.24 mg/dL 1.53(H) 1.56(H) 1.33(H)  BUN/Creat Ratio 10 - 24 - - 12  Sodium 135 - 145 mmol/L 142 139 139  Potassium 3.5 - 5.1 mmol/L 4.8 5.3(H) 4.9  Chloride 98 - 111 mmol/L 96(L) 94(L) 98  CO2 22 - 32 mmol/L 33(H) 35(H) 25  Calcium 8.9 - 10.3 mg/dL 8.5(L) 9.2 9.3    SIGNIFICANT  EVENTS 9/2 admitted for severe resp failure, COPD, pneumonia 9/3 remains on vent  REVIEW OF SYSTEMS  PATIENT IS UNABLE TO PROVIDE COMPLETE REVIEW OF SYSTEMS DUE TO SEVERE CRITICAL ILLNESS   PHYSICAL EXAMINATION:  GENERAL:critically ill appearing, +resp distress HEAD: Normocephalic, atraumatic.  EYES: Pupils equal, round, reactive to light.  No scleral icterus.  MOUTH: Moist mucosal membrane. NECK: Supple.  PULMONARY: +rhonchi, +wheezing CARDIOVASCULAR: S1 and S2. Regular rate and rhythm. No murmurs, rubs, or gallops.  GASTROINTESTINAL: Soft, nontender, -distended. No masses. Positive bowel sounds. No hepatosplenomegaly.  MUSCULOSKELETAL: No swelling, clubbing, or edema.  NEUROLOGIC: obtunded, GCS<8 SKIN:intact,warm,dry  MEDICATIONS: I have reviewed all medications and confirmed regimen as documented   CULTURE RESULTS   Recent Results (from the past 240 hour(s))  SARS Coronavirus 2 [facility] order, Performed in [facility] hospital lab) Nasopharyngeal Nasopharyngeal Swab     Status: None   Collection Time: 11/23/2018  7:57 AM   Specimen: Nasopharyngeal Swab  Result Value Ref Range Status   SARS Coronavirus 2 NEGATIVE NEGATIVE Final    Comment: (NOTE) If result is NEGATIVE SARS-CoV-2 target nucleic acids are NOT DETECTED. The  SARS-CoV-2 RNA is generally detectable in upper and lower  respiratory specimens during the acute phase of infection. The lowest  concentration of SARS-CoV-2 viral copies this assay can detect is 250  copies / mL. A negative result does not preclude SARS-CoV-2 infection  and should not be used as the sole basis for treatment or other  patient management decisions.  A negative result may occur with  improper specimen collection / handling, submission of specimen other  than nasopharyngeal swab, presence of viral mutation(s) within the  areas targeted by this assay, and inadequate number of viral copies  (<250 copies / mL). A negative result must be  combined with clinical  observations, patient history, and epidemiological information. If result is POSITIVE SARS-CoV-2 target nucleic acids are DETECTED. The SARS-CoV-2 RNA is generally detectable in upper and lower  respiratory specimens dur ing the acute phase of infection.  Positive  results are indicative of active infection with SARS-CoV-2.  Clinical  correlation with patient history and other diagnostic information is  necessary to determine patient infection status.  Positive results do  not rule out bacterial infection or co-infection with other viruses. If result is PRESUMPTIVE POSTIVE SARS-CoV-2 nucleic acids MAY BE PRESENT.   A presumptive positive result was obtained on the submitted specimen  and confirmed on repeat testing.  While 2019 novel coronavirus  (SARS-CoV-2) nucleic acids may be present in the submitted sample  additional confirmatory testing may be necessary for epidemiological  and / or clinical management purposes  to differentiate between  SARS-CoV-2 and other Sarbecovirus currently known to infect humans.  If clinically indicated additional testing with an alternate test  methodology [phone]) is advised. The SARS-CoV-2 RNA is generally  detectable in upper and lower respiratory sp ecimens during the acute  phase of infection. The expected result is Negative. Fact Sheet for Patients:  StrictlyIdeas.no Fact Sheet for Healthcare Providers: BankingDealers.co.za This test is not yet approved or cleared by the [place] FDA and has been authorized for detection and/or diagnosis of SARS-CoV-2 by FDA under an Emergency Use Authorization (EUA).  This EUA will remain in effect (meaning this test can be used) for the duration of the COVID-19 declaration under Section 564(b)(1) of the Act, 21 U.S.C. section 360bbb-3(b)(1), unless the authorization is terminated or revoked sooner. Performed at [facility], [address] [place], [place] [address]   MRSA PCR Screening     Status: None   Collection Time: 11/20/2018  1:45 PM   Specimen: Nasal Mucosa; Nasopharyngeal  Result Value Ref Range Status   MRSA by PCR NEGATIVE NEGATIVE Final    Comment:        The GeneXpert MRSA Assay (FDA approved for NASAL specimens only), is one component of a comprehensive MRSA colonization surveillance program. It is not intended to diagnose MRSA infection nor to guide or monitor treatment for MRSA infections. Performed at [facility], [address] [place], [place] [address]   Culture, respiratory (non-expectorated)     Status: None (Preliminary result)   Collection Time: 10/30/2018  4:45 PM   Specimen: Tracheal Aspirate; Respiratory  Result Value Ref Range Status   Specimen Description   Final    TRACHEAL ASPIRATE Performed at [facility], [address], [place], [place] [address]    Special Requests   Final    NONE Performed at [facility], [address] [place], [place] [address]    Gram Stain   Final    MODERATE WBC  PRESENT, PREDOMINANTLY PMN RARE GRAM POSITIVE COCCI Performed at [facility], [place] [address], [place], [place] [address]    Culture PENDING  Incomplete   Report Status PENDING  Incomplete            Indwelling Urinary Catheter continued, requirement due to   Reason to continue Indwelling Urinary Catheter strict Intake/Output monitoring for hemodynamic instability         Ventilator continued, requirement due to severe respiratory failure   Ventilator Sedation RASS 0 to -2      ASSESSMENT AND PLAN SYNOPSIS  Severe ACUTE Hypoxic and Hypercapnic Respiratory Failure from COPD exacerbation and pneumonia  Severe ACUTE Hypoxic and Hypercapnic Respiratory Failure -continue Full MV support -continue Bronchodilator Therapy -Wean Fio2 and PEEP as tolerated -will perform SAT/SBT when respiratory parameters are  met   ACUTE KIDNEY INJURY/Renal Failure -follow chem 7 -follow UO -continue Foley Catheter-assess need -Avoid nephrotoxic agents -Recheck creatinine    NEUROLOGY - intubated and sedated - minimal sedation to achieve a RASS goal: -1 Wake up assessment pending  SEVERE COPD EXACERBATION -continue IV steroids as prescribed -continue NEB THERAPY as prescribed -morphine as needed -wean fio2 as needed and tolerated     CARDIAC ICU monitoring  ID -continue IV abx as prescibed -follow up cultures  GI GI PROPHYLAXIS as indicated  NUTRITIONAL STATUS DIET-->TF's as tolerated Constipation protocol as indicated  ENDO - will use ICU hypoglycemic\Hyperglycemia protocol if indicated   ELECTROLYTES -follow labs as needed -replace as needed -pharmacy consultation and following   DVT/GI PRX ordered TRANSFUSIONS AS NEEDED MONITOR FSBS ASSESS the need for LABS as needed   Critical Care Time devoted to patient care services described in this note is 35 minutes.   Overall, patient is critically ill, prognosis is guarded.   Daughter updated yesterday  Corrin Parker, M.D.  [facility]  Medical Director [place] Director [facility] Cardio-Pulmonary Department

## 2018-10-27 NOTE — Progress Notes (Signed)
Patient complaining of continued anxiety.  Xanax 0.5mg  PO given.

## 2018-10-28 DIAGNOSIS — J441 Chronic obstructive pulmonary disease with (acute) exacerbation: Secondary | ICD-10-CM

## 2018-10-28 LAB — CBC WITH DIFFERENTIAL/PLATELET
Abs Immature Granulocytes: 0.06 10*3/uL (ref 0.00–0.07)
Basophils Absolute: 0.1 10*3/uL (ref 0.0–0.1)
Basophils Relative: 0 %
Eosinophils Absolute: 0 10*3/uL (ref 0.0–0.5)
Eosinophils Relative: 0 %
HCT: 49.9 % (ref 39.0–52.0)
Hemoglobin: 14.5 g/dL (ref 13.0–17.0)
Immature Granulocytes: 1 %
Lymphocytes Relative: 8 %
Lymphs Abs: 1 10*3/uL (ref 0.7–4.0)
MCH: 27.1 pg (ref 26.0–34.0)
MCHC: 29.1 g/dL — ABNORMAL LOW (ref 30.0–36.0)
MCV: 93.1 fL (ref 80.0–100.0)
Monocytes Absolute: 1.2 10*3/uL — ABNORMAL HIGH (ref 0.1–1.0)
Monocytes Relative: 9 %
Neutro Abs: 10.2 10*3/uL — ABNORMAL HIGH (ref 1.7–7.7)
Neutrophils Relative %: 82 %
Platelets: 304 10*3/uL (ref 150–400)
RBC: 5.36 MIL/uL (ref 4.22–5.81)
RDW: 16.6 % — ABNORMAL HIGH (ref 11.5–15.5)
WBC: 12.5 10*3/uL — ABNORMAL HIGH (ref 4.0–10.5)
nRBC: 0.2 % (ref 0.0–0.2)

## 2018-10-28 LAB — BASIC METABOLIC PANEL
Anion gap: 9 (ref 5–15)
BUN: 46 mg/dL — ABNORMAL HIGH (ref 8–23)
CO2: 37 mmol/L — ABNORMAL HIGH (ref 22–32)
Calcium: 8 mg/dL — ABNORMAL LOW (ref 8.9–10.3)
Chloride: 95 mmol/L — ABNORMAL LOW (ref 98–111)
Creatinine, Ser: 1.29 mg/dL — ABNORMAL HIGH (ref 0.61–1.24)
GFR calc Af Amer: >60 mL/min (ref >60)
GFR calc non Af Amer: 53 mL/min — ABNORMAL LOW (ref >60)
Glucose, Bld: 86 mg/dL (ref 70–99)
Potassium: 5.7 mmol/L — ABNORMAL HIGH (ref 3.5–5.1)
Sodium: 141 mmol/L (ref 135–145)

## 2018-10-28 LAB — GLUCOSE, CAPILLARY
Glucose-Capillary: 80 mg/dL (ref 70–99)
Glucose-Capillary: 106 mg/dL — ABNORMAL HIGH (ref 70–99)

## 2018-10-28 LAB — POTASSIUM: Potassium: 4.7 mmol/L (ref 3.5–5.1)

## 2018-10-28 LAB — URINE CULTURE: Culture: NO GROWTH

## 2018-10-28 LAB — PHOSPHORUS: Phosphorus: 4.7 mg/dL — ABNORMAL HIGH (ref 2.5–4.6)

## 2018-10-28 LAB — MAGNESIUM: Magnesium: 2.6 mg/dL — ABNORMAL HIGH (ref 1.7–2.4)

## 2018-10-28 MED ORDER — FUROSEMIDE 10 MG/ML IJ SOLN
40.0000 mg | Freq: Every day | INTRAMUSCULAR | Status: DC
  Administered 2018-10-29 – 2018-10-30 (×2): 40 mg via INTRAVENOUS
  Filled 2018-10-28 (×2): qty 4

## 2018-10-28 MED ORDER — AMLODIPINE BESYLATE 5 MG PO TABS
5.0000 mg | ORAL_TABLET | Freq: Every day | ORAL | Status: DC
  Administered 2018-10-29 – 2018-10-30 (×2): 5 mg via ORAL
  Filled 2018-10-28 (×2): qty 1

## 2018-10-28 MED ORDER — PANTOPRAZOLE SODIUM 40 MG PO TBEC
40.0000 mg | DELAYED_RELEASE_TABLET | Freq: Every day | ORAL | Status: DC
  Administered 2018-10-28 – 2018-10-30 (×3): 40 mg via ORAL
  Filled 2018-10-28 (×3): qty 1

## 2018-10-28 MED ORDER — AZITHROMYCIN 250 MG PO TABS
250.0000 mg | ORAL_TABLET | Freq: Every day | ORAL | Status: AC
  Administered 2018-10-29 – 2018-10-30 (×2): 250 mg via ORAL
  Filled 2018-10-28 (×2): qty 1

## 2018-10-28 MED ORDER — FUROSEMIDE 10 MG/ML IJ SOLN
20.0000 mg | Freq: Once | INTRAMUSCULAR | Status: AC
  Administered 2018-10-28: 13:00:00 20 mg via INTRAVENOUS
  Filled 2018-10-28: qty 2

## 2018-10-28 MED ORDER — FEXOFENADINE HCL 180 MG PO TABS
180.0000 mg | ORAL_TABLET | Freq: Every day | ORAL | Status: DC
  Administered 2018-10-29 – 2018-10-30 (×2): 180 mg via ORAL
  Filled 2018-10-28 (×3): qty 1

## 2018-10-28 MED ORDER — SERTRALINE HCL 50 MG PO TABS
50.0000 mg | ORAL_TABLET | Freq: Every day | ORAL | Status: DC
  Administered 2018-10-29 – 2018-10-30 (×2): 50 mg via ORAL
  Filled 2018-10-28 (×2): qty 1

## 2018-10-28 MED ORDER — SODIUM ZIRCONIUM CYCLOSILICATE 5 G PO PACK
10.0000 g | PACK | Freq: Every day | ORAL | Status: DC
  Administered 2018-10-29 – 2018-10-30 (×2): 10 g via ORAL
  Filled 2018-10-28: qty 2
  Filled 2018-10-28 (×2): qty 1

## 2018-10-28 NOTE — Progress Notes (Signed)
CRITICAL CARE NOTE  CC  follow up respiratory failure  SUBJECTIVE extubated yesterday No severe distress On oxygen Alert and awake     BP 137/67   Pulse 96   Temp 98.5 F (36.9 C) (Axillary)   Resp (!) 23   Ht 6' (1.829 m)   Wt 90.7 kg   SpO2 94%   BMI 27.12 kg/m    I/O last 3 completed shifts: In: 1100.5 [I.V.:900.6; IV Piggyback:199.9] Out: 2785 [Urine:2575; Emesis/NG output:210] No intake/output data recorded.  SpO2: 94 % O2 Flow Rate (L/min): 8 L/min FiO2 (%): 35 %   SIGNIFICANT EVENTS 9/3 COPD exacerbation pneumonia and VENT 9/4 extubated to high flow [place]  REVIEW OF SYSTEMS  PATIENT IS UNABLE TO PROVIDE COMPLETE REVIEW OF SYSTEMS DUE TO SEVERE CRITICAL ILLNESS   PHYSICAL EXAMINATION:  GENERAL:NAD HEAD: Normocephalic, atraumatic.  EYES: Pupils equal, round, reactive to light.  No scleral icterus.  MOUTH: Moist mucosal membrane. NECK: Supple.  PULMONARY: +rhonchi,  CARDIOVASCULAR: S1 and S2. Regular rate and rhythm. No murmurs, rubs, or gallops.  GASTROINTESTINAL: Soft, nontender, -distended. No masses. Positive bowel sounds. No hepatosplenomegaly.  MUSCULOSKELETAL: No swelling, clubbing, or edema.  NEUROLOGIC: alert and awake SKIN:intact,warm,dry  MEDICATIONS: I have reviewed all medications and confirmed regimen as documented   CULTURE RESULTS   Recent Results (from the past 240 hour(s))  SARS Coronavirus 2 [facility](Hospital order, Performed [facility]) Nasopharyngeal Nasopharyngeal Swab     Status: None   Collection Time: 04/02/2018  7:57 AM   Specimen: Nasopharyngeal Swab  Result Value Ref Range Status   SARS Coronavirus 2 NEGATIVE NEGATIVE Final    Comment: (NOTE) If result is NEGATIVE SARS-CoV-2 target nucleic acids are NOT DETECTED. The SARS-CoV-2 RNA is generally detectable in upper and lower  respiratory specimens during the acute phase of infection. The lowest  concentration of SARS-CoV-2 viral copies this assay can detect  is 250  copies / mL. A negative result does not preclude SARS-CoV-2 infection  and should not be used as the sole basis for treatment or other  patient management decisions.  A negative result may occur with  improper specimen collection / handling, submission of specimen other  than nasopharyngeal swab, presence of viral mutation(s) within the  areas targeted by this assay, and inadequate number of viral copies  (<250 copies / mL). A negative result must be combined with clinical  observations, patient history, and epidemiological information. If result is POSITIVE SARS-CoV-2 target nucleic acids are DETECTED. The SARS-CoV-2 RNA is generally detectable in upper and lower  respiratory specimens dur ing the acute phase of infection.  Positive  results are indicative of active infection with SARS-CoV-2.  Clinical  correlation with patient history and other diagnostic information is  necessary to determine patient infection status.  Positive results do  not rule out bacterial infection or co-infection with other viruses. If result is PRESUMPTIVE POSTIVE SARS-CoV-2 nucleic acids MAY BE PRESENT.   A presumptive positive result was obtained on the submitted specimen  and confirmed on repeat testing.  While 2019 novel coronavirus  (SARS-CoV-2) nucleic acids may be present in the submitted sample  additional confirmatory testing may be necessary for epidemiological  and / or clinical management purposes  to differentiate between  SARS-CoV-2 and other Sarbecovirus currently known to infect humans.  If clinically indicated additional testing with an alternate test  methodology [phone] is advised. The SARS-CoV-2 RNA is generally  detectable in upper and lower respiratory sp ecimens during the acute  phase of infection. The expected result is Negative. Fact Sheet for Patients:  BoilerBrush.com.cy Fact Sheet for Healthcare  Providers: https://pope.com/ This test is not yet approved or cleared by the [place] FDA and has been authorized for detection and/or diagnosis of SARS-CoV-2 by FDA under an Emergency Use Authorization (EUA).  This EUA will remain in effect (meaning this test can be used) for the duration of the COVID-19 declaration under Section 564(b)(1) of the Act, 21 U.S.C. section 360bbb-3(b)(1), unless the authorization is terminated or revoked sooner. Performed at [facility], [address] [place], [place] [address]   Culture, blood (routine x 2)     Status: None (Preliminary result)   Collection Time: 11/20/18 10:28 AM   Specimen: BLOOD  Result Value Ref Range Status   Specimen Description BLOOD RIGHT HAND  Final   Special Requests   Final    BOTTLES DRAWN AEROBIC AND ANAEROBIC Blood Culture results may not be optimal due to an inadequate volume of blood received in culture bottles   Culture   Final    NO GROWTH 2 DAYS Performed at [facility], [address], [place], [place] [address]    Report Status PENDING  Incomplete  Culture, blood (routine x 2)     Status: Abnormal (Preliminary result)   Collection Time: 2018/11/20 10:28 AM   Specimen: BLOOD  Result Value Ref Range Status   Specimen Description BLOOD LEFT ARM  Final   Special Requests (A)  Final    AEROCOCCUS SPECIES Blood Culture results may not be optimal due to an inadequate volume of blood received in culture bottles   Culture   Final    NO GROWTH 2 DAYS Performed at [facility], [address] [place], [place] [address]    Report Status PENDING  Incomplete  MRSA PCR Screening     Status: None   Collection Time: 2018-11-20  1:45 PM   Specimen: Nasal Mucosa; Nasopharyngeal  Result Value Ref Range Status   MRSA by PCR NEGATIVE NEGATIVE Final    Comment:        The GeneXpert MRSA Assay (FDA approved for NASAL specimens only), is one component of  a comprehensive MRSA colonization surveillance program. It is not intended to diagnose MRSA infection nor to guide or monitor treatment for MRSA infections. Performed at [facility], [address] [place], [place] [address]   Culture, respiratory (non-expectorated)     Status: None (Preliminary result)   Collection Time: 11/20/2018  4:45 PM   Specimen: Tracheal Aspirate; Respiratory  Result Value Ref Range Status   Specimen Description   Final    TRACHEAL ASPIRATE Performed at [facility], [address] [place], [place] [address]    Special Requests   Final    NONE Performed at [facility], [address] [place], [place] [address]    Gram Stain   Final    MODERATE WBC PRESENT, PREDOMINANTLY PMN RARE GRAM POSITIVE COCCI    Culture   Final    TOO YOUNG TO READ Performed at [facility], [address], [place], [place] [address]    Report Status PENDING  Incomplete             ASSESSMENT AND PLAN SYNOPSIS   Severe ACUTE Hypoxic and Hypercapnic Respiratory Failure Extubated, resolving slowly -continue Bronchodilator Therapy -Wean Fio2   SEVERE COPD EXACERBATION -continue IV steroids as prescribed -continue NEB THERAPY as prescribed -morphine as needed -wean fio2  as needed and tolerated  ID -continue IV abx as prescibed -follow up cultures  GI GI PROPHYLAXIS as indicated  NUTRITIONAL STATUS DIET-->advance as tolerated Constipation protocol as indicated  ENDO - will use ICU hypoglycemic\Hyperglycemia protocol if indicated   ELECTROLYTES -follow labs as needed -replace as needed -pharmacy consultation and following   DVT/GI PRX ordered TRANSFUSIONS AS NEEDED MONITOR FSBS ASSESS the need for LABS as needed   Consider transfer to gen med floor    Athziri Freundlich Patricia Pesa, M.D.  [facility]  Medical Director [place] Director [place]  Department

## 2018-10-28 NOTE — Progress Notes (Signed)
PHARMACIST - PHYSICIAN COMMUNICATION  DR:   Mortimer Fries  CONCERNING: IV to Oral Route Change Policy  RECOMMENDATION: This patient is receiving pantoprazole by the intravenous route.  Based on criteria approved by the Pharmacy and Therapeutics Committee, the intravenous medication(s) is/are being converted to the equivalent oral dose form(s).   DESCRIPTION: These criteria include:  The patient is eating (either orally or via tube) and/or has been taking other orally administered medications for a least 24 hours  The patient has no evidence of active gastrointestinal bleeding or impaired GI absorption (gastrectomy, short bowel, patient on TNA or NPO).  If you have questions about this conversion, please contact the [place], [facility] 10/28/2018 9:00 AM

## 2018-10-28 NOTE — Progress Notes (Signed)
Patient daughter Butch Penny updated via phone

## 2018-10-28 NOTE — Progress Notes (Addendum)
[place] [facility] [place] NAME: Hasan Douse    MR#:  [id number]  DATE OF BIRTH:  May 29, 1940  Extubated.,  Awake but noted to have some.  CHIEF COMPLAINT:   Chief Complaint  Patient presents with  . Weakness  . Fall    REVIEW OF SYSTEMS:   ROS Hard of hearing. DRUG ALLERGIES:  No Known Allergies  VITALS:  Blood pressure (!) 136/57, pulse 91, temperature 98.5 F (36.9 C), temperature source Oral, resp. rate (!) 28, height 6' (1.829 m), weight 90.7 kg, SpO2 90 %.  PHYSICAL EXAMINATION:  GENERAL:  78 y.o.-year-old patient lying in bed, noted to have some cough, mild shortness of breath extubated. EYES: Pupils equal, round, reactive to light No scleral icterus.  HEENT: Head atraumatic, normocephalic.  Orally intubated. NECK:   no jugular venous distention. No thyroid enlargement, no tenderness.  LUNGS: Diminished breath sound bilaterally.    CARDIOVASCULAR: S1, S2 normal. No murmurs, rubs, or gallops.  ABDOMEN: Soft, , nondistended. Bowel sounds present. No organomegaly or mass.  EXTREMITIES: No pedal edema, cyanosis, or clubbing.  NEUROLOGIC: Extubated, following commands, no gross new focal neurological deficit.  Cranial nerves II to XII intact, 5/5 left upper and lower extremities.  PSYCHIATRIC: Intubated sedated.  SKIN: No obvious rash, lesion, or ulcer.    LABORATORY PANEL:   CBC Recent Labs  Lab 10/28/18 0418  WBC 12.5*  HGB 14.5  HCT 49.9  PLT 304   ------------------------------------------------------------------------------------------------------------------  Chemistries  Recent Labs  Lab 10/28/18 0418  NA 141  K 5.7*  CL 95*  CO2 37*  GLUCOSE 86  BUN 46*  CREATININE 1.29*  CALCIUM 8.0*  MG 2.6*   ------------------------------------------------------------------------------------------------------------------  Cardiac Enzymes No results for input(s): TROPONINI in the last 168  hours. ------------------------------------------------------------------------------------------------------------------  RADIOLOGY:  Dg Abdomen 1 View  Result Date: 11/21/18 CLINICAL DATA:  Orogastric tube placement. EXAM: ABDOMEN - 1 VIEW COMPARISON:  Portable chest obtained at the same time. FINDINGS: Endotracheal tube in satisfactory position. Orogastric tube extending into the stomach with the side hole mid to distal stomach and tip not included. See the portable chest report for the findings in the chest. Thoracic and upper lumbar spine degenerative changes. IMPRESSION: Orogastric tube extending into the stomach with the side hole mid to distal stomach and tip not included. Electronically Signed   By: Claudie Revering M.D.   On: 11-21-2018 12:38   Dg Chest Portable 1 View  Result Date: November 21, 2018 CLINICAL DATA:  Intubated. EXAM: PORTABLE CHEST 1 VIEW COMPARISON:  Earlier today. FINDINGS: Interval endotracheal tube in satisfactory position. Stable enlarged cardiac silhouette and prominent pulmonary vasculature and interstitial markings. Patchy opacity is again demonstrated in the left mid lung zone. A small amount of patchy and linear density at both lung bases demonstrates mild progression. Small left pleural effusion. Thoracic spine degenerative changes. IMPRESSION: 1. Stable patchy opacity in the left mid lung zone, suspicious for pneumonia. 2. Mildly progressive bibasilar atelectasis and possible pneumonia. 3. Small left pleural effusion. 4. Stable cardiomegaly, pulmonary vascular congestion and chronic interstitial lung disease. Electronically Signed   By: Claudie Revering M.D.   On: 11-21-18 12:37   Dg Abd Portable 1v  Result Date: 11-21-2018 CLINICAL DATA:  Encounter for OG tube placement. EXAM: PORTABLE ABDOMEN - 1 VIEW COMPARISON:  None. FINDINGS: NG tube extends the stomach. Side port beneath the GE junction. LEFT lower lobe airspace disease. IMPRESSION: NG tube in stomach. Electronically  Signed   By:  Genevive BiStewart  Edmunds M.D.   On: Sep 25, 2018 15:18    EKG:   Orders placed or performed during the hospital encounter of 07-15-18  . EKG 12-Lead  . EKG 12-Lead    ASSESSMENT AND PLAN:  #1. severe acute respiratory failure with hypoxia, hypercapnia secondary to COPD exacerbation, pneumonia, extubated #steroids, nebulizers, antibiotics  3. acute kidney injury, monitor closely, improved.  Patient does have h hypokalemia in the labs today 4..   UTI, patient already on antibiotics follow urine cultures.   already is on Rocephin. 5. acute respiratory failure due to combination of CHF and the patient, pneumonia, echocardiogram is done, results are pending. #6. condition guarded,  DVT, GI prophylaxis. Transfer to medical floor. All the records are reviewed and case discussed with Care Management/Social Workerr. Management plans discussed with the patient, family and they are in agreement.  CODE STATUS: Full code  TOTAL TIME TAKING CARE OF THIS PATIENT: 35 minutes.   POSSIBLE D/C IN 1-2 DAYS, DEPENDING ON CLINICAL CONDITION.   Katha HammingSnehalatha Latoya Maulding M.D on 10/28/2018 at 11:56 AM  Between 7am to 6pm - Pager - [phone]  After 6pm go to [email or web address] - password [email or web address] [facility]  [facility]  Office  [phone]  CC: Primary care physician; Tamsen Roershrismon, Dennis E, PA   Note: This dictation was prepared with Dragon dictation along with smaller phrase technology. Any transcriptional errors that result from this process are unintentional.

## 2018-10-28 NOTE — Evaluation (Signed)
Clinical/Bedside Swallow Evaluation Patient Details  Name: MONIQUE GIFT MRN: [id number] Date of Birth: 12-06-1940  Today's Date: 10/28/2018 Time: SLP Start Time (ACUTE ONLY): 1340 SLP Stop Time (ACUTE ONLY): 1440 SLP Time Calculation (min) (ACUTE ONLY): 60 min  Past Medical History: History reviewed. No pertinent past medical history. Past Surgical History:  Past Surgical History:  Procedure Laterality Date  . HERNIA REPAIR Bilateral 08/08/2010   HPI:  Pt is a 78 y.o. male with past medical history of Multiple Medical issues including a Large Hiatal Hernia, Reflux/acid indigestion, hypertension, anxiety and adjustment dis, and COPD who presents to the ED with weakness and fall.  History is limited as patient is very hard of hearing.  Per EMS, patient was found to fallen in the bathroom by family earlier this morning.  They had reported that he had been increasingly weak over the past couple of days.  EMS was not able to locate any traumatic injuries, but found patient to be hypoxic on room air to 79%.  He does not wear oxygen at home, was placed on 4 L nasal cannula with improvement.  Patient admits to feeling somewhat short of breath, but denies any fevers, cough, or chest pain.  Patient received multiple dose of nebulizers, steroids, Lasix in the emergency room, blood work is essentially normal except slight dehydration, COVID-19 test is negative, patient chest x-ray is content concerning for bibasilar pneumonia/pulmonary edema, patient is admitted to medical service for COPD exacerbation.  ABG showed severe respiratory acidosis thus pt was orally intubated 09/30/20202, extubated on 10/27/2018.  He is on 6L of O2 via [place] w/ O2 sats ~90%.    Assessment / Plan / Recommendation Clinical Impression  Pt appears to present w/ an oropharyngeal phase swallow potentially wfl, however, w/ most/all trials given, pt exhibited a Belch followed by coughing. Dtr present stated this was "typical of him at home". She  even when on to describe Regurgitation of food/liquids; pt endorsed same as well as coughing on phlegm material. 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 ANY Esophageal retrograde and/or regurgitated material can increase risk for aspiration of such thus have a negative impact on the Pulmonary status. Recommend a more minced foods diet w/ purees added in hopes to ease Esophageal motility thus reduce dysmotility. Dtr present; education and information given.  SLP Visit Diagnosis: Dysphagia, pharyngoesophageal phase (R13.14)    Aspiration Risk  Mild aspiration risk;Moderate aspiration risk;Risk for inadequate nutrition/hydration(of Reflux material)    Diet Recommendation  Dysphagia level 2 (MINCED foods moistened) w/ thin liquids; aspiration precautions; STRICT REFLUX precautions  Medication Administration: Whole meds with puree(for ease/safer swallowing)    Other  Recommendations Recommended Consults: Consider GI evaluation;Consider esophageal assessment(Dietician f/u; Palliative Care consult for [place]) Oral Care Recommendations: Oral care BID;Staff/trained caregiver to provide oral care(Dentures - may need adhesive to secure)   Follow up Recommendations (TBD)      Frequency and Duration min 2x/week  1 week       Prognosis Prognosis for Safe Diet Advancement: Guarded Barriers to Reach Goals: Time post onset;Severity of deficits(Large Hiatal Hernia)      Swallow Study   General Date of Onset: 11/04/2018 HPI: Pt is a 78 y.o. male with past medical history of Multiple Medical issues including a Large Hiatal Hernia, Reflux/acid indigestion, hypertension, anxiety and adjustment dis, and COPD who presents to the ED  with weakness and fall.  History is limited as patient is very hard of hearing.  Per EMS, patient was found to fallen in the bathroom by family  earlier this morning.  They had reported that he had been increasingly weak over the past couple of days.  EMS was not able to locate any traumatic injuries, but found patient to be hypoxic on room air to 79%.  He does not wear oxygen at home, was placed on 4 L nasal cannula with improvement.  Patient admits to feeling somewhat short of breath, but denies any fevers, cough, or chest pain.  Patient received multiple dose of nebulizers, steroids, Lasix in the emergency room, blood work is essentially normal except slight dehydration, COVID-19 test is negative, patient chest x-ray is content concerning for bibasilar pneumonia/pulmonary edema, patient is admitted to medical service for COPD exacerbation.  ABG showed severe respiratory acidosis thus pt was orally intubated 9/2/202, extubated on 10/27/2018.  He is on 6L of O2 via [place] w/ O2 sats ~90%.  Type of Study: Bedside Swallow Evaluation Previous Swallow Assessment: none reported Diet Prior to this Study: Regular;Thin liquids(initially but now NPO) Temperature Spikes Noted: No(wbc 12.5) Respiratory Status: Nasal cannula(6L) History of Recent Intubation: Yes Length of Intubations (days): 1 days Date extubated: 10/27/18 Behavior/Cognition: Alert;Cooperative;Pleasant mood;Distractible;Requires cueing(HOH) Oral Cavity Assessment: Within Functional Limits Oral Care Completed by SLP: Recent completion by staff Oral Cavity - Dentition: Dentures, top;Dentures, bottom(but NOT wearing them currently) Vision: Functional for self-feeding Self-Feeding Abilities: Able to feed self;Needs assist;Needs set up(shaky UEs) Patient Positioning: Upright in bed(needed positioning) Baseline Vocal Quality: Hoarse(Gravely; easily SOB w/ exertion) Volitional Cough: Strong Volitional Swallow: Able to elicit    Oral/Motor/Sensory Function Overall Oral Motor/Sensory Function: Within functional limits   Ice Chips Ice chips: Impaired Presentation: Spoon(2 trials) Oral Phase  Impairments: (none) Oral Phase Functional Implications: (none) Pharyngeal Phase Impairments: (belch followed by cough)   Thin Liquid Thin Liquid: Impaired Presentation: Cup;Self Fed;Straw(4 trials each method) Oral Phase Impairments: (none) Oral Phase Functional Implications: (none) Pharyngeal  Phase Impairments: (belch followed by cough most trials)    Nectar Thick Nectar Thick Liquid: Not tested   Honey Thick Honey Thick Liquid: Not tested   Puree Puree: Impaired Presentation: Spoon(fed; 8 trials) Oral Phase Impairments: (none) Oral Phase Functional Implications: (none) Pharyngeal Phase Impairments: (belch followed by cough most trials)   Solid     Solid: Not tested Other Comments: did not have dentures in place - loose fitting per report so suggested adhesive to secure        Jerilynn SomKatherine Watson, MS, CCC-SLP Watson,Katherine 10/28/2018,4:58 PM

## 2018-10-29 LAB — CULTURE, RESPIRATORY W GRAM STAIN: Culture: NORMAL

## 2018-10-29 LAB — ECHOCARDIOGRAM COMPLETE
Height: 72 in
Weight: 3200 oz

## 2018-10-29 LAB — CBC WITH DIFFERENTIAL/PLATELET
Abs Immature Granulocytes: 0.05 10*3/uL (ref 0.00–0.07)
Basophils Absolute: 0 10*3/uL (ref 0.0–0.1)
Basophils Relative: 0 %
Eosinophils Absolute: 0 10*3/uL (ref 0.0–0.5)
Eosinophils Relative: 0 %
HCT: 43.4 % (ref 39.0–52.0)
Hemoglobin: 13 g/dL (ref 13.0–17.0)
Immature Granulocytes: 1 %
Lymphocytes Relative: 3 %
Lymphs Abs: 0.3 10*3/uL — ABNORMAL LOW (ref 0.7–4.0)
MCH: 27.2 pg (ref 26.0–34.0)
MCHC: 30 g/dL (ref 30.0–36.0)
MCV: 90.8 fL (ref 80.0–100.0)
Monocytes Absolute: 0.2 10*3/uL (ref 0.1–1.0)
Monocytes Relative: 2 %
Neutro Abs: 9.6 10*3/uL — ABNORMAL HIGH (ref 1.7–7.7)
Neutrophils Relative %: 94 %
Platelets: 297 10*3/uL (ref 150–400)
RBC: 4.78 MIL/uL (ref 4.22–5.81)
RDW: 15.9 % — ABNORMAL HIGH (ref 11.5–15.5)
WBC: 10 10*3/uL (ref 4.0–10.5)
nRBC: 0.2 % (ref 0.0–0.2)

## 2018-10-29 LAB — BASIC METABOLIC PANEL
Anion gap: 10 (ref 5–15)
BUN: 38 mg/dL — ABNORMAL HIGH (ref 8–23)
CO2: 37 mmol/L — ABNORMAL HIGH (ref 22–32)
Calcium: 8.3 mg/dL — ABNORMAL LOW (ref 8.9–10.3)
Chloride: 93 mmol/L — ABNORMAL LOW (ref 98–111)
Creatinine, Ser: 1.02 mg/dL (ref 0.61–1.24)
GFR calc Af Amer: >60 mL/min (ref >60)
GFR calc non Af Amer: >60 mL/min (ref >60)
Glucose, Bld: 129 mg/dL — ABNORMAL HIGH (ref 70–99)
Potassium: 5.4 mmol/L — ABNORMAL HIGH (ref 3.5–5.1)
Sodium: 140 mmol/L (ref 135–145)

## 2018-10-29 LAB — GLUCOSE, CAPILLARY: Glucose-Capillary: 113 mg/dL — ABNORMAL HIGH (ref 70–99)

## 2018-10-29 MED ORDER — IPRATROPIUM-ALBUTEROL 0.5-2.5 (3) MG/3ML IN SOLN
3.0000 mL | Freq: Four times a day (QID) | RESPIRATORY_TRACT | Status: DC
  Administered 2018-10-29 – 2018-10-31 (×6): 3 mL via RESPIRATORY_TRACT
  Filled 2018-10-29 (×6): qty 3

## 2018-10-29 NOTE — Progress Notes (Signed)
Asked ICU nurse to remove Foley multiple times during transfer report, seeing as patient had no indication to be transferred with one. She says, "we'll see". Patient transferred with Foley in place for absolutely no reason. Patient wants Foley removed. Will remove at this time. Wenda Low [facility]

## 2018-10-29 NOTE — Plan of Care (Signed)
  Problem: Education: Goal: Knowledge of General Education information will improve Description: Including pain rating scale, medication(s)/side effects and non-pharmacologic comfort measures Outcome: Progressing   Problem: Health Behavior/Discharge Planning: Goal: Ability to manage health-related needs will improve Outcome: Progressing Note: Removed Foley catheter earlier in shift. Patient assisted to use the urinal shortly thereafter. Will continue to monitor overall progression. Wenda Low [facility]

## 2018-10-29 NOTE — Progress Notes (Signed)
Patients daughter came in for visit and stated that he had barium swallow test here at [facility] back on 04/21/2012 and then had EGD done over at [facility] as well. Unable to access that record through [place]. Daughter did inquire about possible repeat barium swallow to check on his hernia. Pt daughter also wanted to know what echo results were and reviewed that report is still pending.

## 2018-10-29 NOTE — Progress Notes (Addendum)
[place] at [place] NAME: Joran Kallal    MR#:  [id number]  DATE OF BIRTH:  [birth year]  Has some cough but otherwise denies any complaints.  Very hard of hearing.  CHIEF COMPLAINT:   Chief Complaint  Patient presents with  . Weakness  . Fall    REVIEW OF SYSTEMS:   Review of Systems  Constitutional: Negative for chills and fever.  HENT: Negative for hearing loss.   Eyes: Negative for blurred vision, double vision and photophobia.  Respiratory: Positive for cough, sputum production and shortness of breath. Negative for hemoptysis.   Cardiovascular: Negative for palpitations, orthopnea and leg swelling.  Gastrointestinal: Negative for abdominal pain, diarrhea and vomiting.  Genitourinary: Negative for dysuria and urgency.  Musculoskeletal: Negative for myalgias and neck pain.  Skin: Negative for rash.  Neurological: Negative for dizziness, focal weakness, seizures, weakness and headaches.  Psychiatric/Behavioral: Negative for memory loss. The patient does not have insomnia.    Hard of hearing. DRUG ALLERGIES:  No Known Allergies  VITALS:  Blood pressure 137/77, pulse 89, temperature 98.7 F (37.1 C), temperature source Oral, resp. rate (!) 30, height 6' (1.829 m), weight 91.2 kg, SpO2 94 %.  PHYSICAL EXAMINATION:  GENERAL:  78 y.o.-year-old patient lying in bed, noted to have some cough,  EYES: Pupils equal, round, reactive to light No scleral icterus.  HEENT: Head atraumatic, normocephalic.   NECK:   no jugular venous distention. No thyroid enlargement, no tenderness.  LUNGS: Diminished breath sound bilaterally.    CARDIOVASCULAR: S1, S2 normal. No murmurs, rubs, or gallops.  ABDOMEN: Soft, , nondistended. Bowel sounds present. No organomegaly or mass.  EXTREMITIES: No pedal edema, cyanosis, or clubbing.  NEUROLOGIC: Alert, awake, oriented, very hard of hearing no focal neurological deficit is  observed.  PSYCHIATRIC: Alert, awake and hard of hearing .  SKIN: No obvious rash, lesion, or ulcer.    LABORATORY PANEL:   CBC Recent Labs  Lab 10/29/18 0726  WBC 10.0  HGB 13.0  HCT 43.4  PLT 297   ------------------------------------------------------------------------------------------------------------------  Chemistries  Recent Labs  Lab 10/28/18 0418  10/29/18 0726  NA 141  --  140  K 5.7*   < > 5.4*  CL 95*  --  93*  CO2 37*  --  37*  GLUCOSE 86  --  129*  BUN 46*  --  38*  CREATININE 1.29*  --  1.02  CALCIUM 8.0*  --  8.3*  MG 2.6*  --   --    < > = values in this interval not displayed.   ------------------------------------------------------------------------------------------------------------------  Cardiac Enzymes No results for input(s): TROPONINI in the last 168 hours. ------------------------------------------------------------------------------------------------------------------  RADIOLOGY:  No results found.  EKG:   Orders placed or performed during the hospital encounter of 11/09/2018  . EKG 12-Lead  . EKG 12-Lead    ASSESSMENT AND PLAN:  #1. severe acute respiratory failure with hypoxia, hypercapnia secondary to COPD exacerbation, pneumonia, status post intubation, extubation, doing well on 3 L of oxygen and saturation around 94%.   3. acute kidney injury, monitor closely, improved.    4..   UTI, patient already on antibiotics urine cultures negative.   5. acute respiratory failure due to combination of CHF and the patient, pneumonia, echocardiogram is done, results are pending.  Continue IV Lasix, increased to 40 mg daily.  #6 dysphagia, seen by speech therapy, patient on dysphagia 2 diet,    #7. hyperkalemia, patient receiving [facility]  10 mg p.o. daily.   Transfer the patient to medical floor. DVT, GI prophylaxis. Transfer to medical floor. All the records are reviewed and case discussed with Care Management/Social  Workerr. Management plans discussed with the patient, family and they are in agreement.  CODE STATUS: Full code  TOTAL TIME TAKING CARE OF THIS PATIENT: 35 minutes.   POSSIBLE D/C IN 1-2 DAYS, DEPENDING ON CLINICAL CONDITION.   Katha HammingSnehalatha Nerissa Constantin M.D on 10/29/2018 at 12:00 PM  Between 7am to 6pm - Pager - [phone]  After 6pm go to [email or web address] - password [email or web address] [facility]  [facility]  Office  [phone]  CC: Primary care physician; Tamsen Roershrismon, Dennis E, PA   Note: This dictation was prepared with Dragon dictation along with smaller phrase technology. Any transcriptional errors that result from this process are unintentional.

## 2018-10-30 ENCOUNTER — Inpatient Hospital Stay: Payer: Medicare PPO

## 2018-10-30 LAB — GLUCOSE, CAPILLARY
Glucose-Capillary: 143 mg/dL — ABNORMAL HIGH (ref 70–99)
Glucose-Capillary: 111 mg/dL — ABNORMAL HIGH (ref 70–99)

## 2018-10-30 NOTE — Plan of Care (Signed)
  Problem: Education: Goal: Knowledge of General Education information will improve Description: Including pain rating scale, medication(s)/side effects and non-pharmacologic comfort measures Outcome: Progressing   Problem: Clinical Measurements: Goal: Respiratory complications will improve Outcome: Progressing   Problem: Safety: Goal: Ability to remain free from injury will improve Outcome: Progressing   Problem: Activity: Goal: Ability to tolerate increased activity will improve Outcome: Progressing

## 2018-10-30 NOTE — Progress Notes (Signed)
Patient observed after an unwitnessed episode of nausea and vomiting after daughter claims "he ate too fast". She also mentioned prior esophageal and hernia issues. Alfonse Ras, RN giving patient some IV PRN Zofran for the N+V. Also given emesis bag. Daughter inquired about changing patient's sacral foam pad, which today is 61 days old. Asked Keda to change prior to the end of the shift, she states she will. Will continue to monitor GI status. Wenda Low [facility]

## 2018-10-30 NOTE — Progress Notes (Addendum)
[place] at [place] NAME: Andrew Hall    MR#:  [id number]  DATE OF BIRTH:  Jul 14, 1940  Sitting in the chair, very hard of hearing noted to have some cough, noted to have bruising both hands, patient CBC shows no thrombocytopenia  CHIEF COMPLAINT:   Chief Complaint  Patient presents with  . Weakness  . Fall  Has some cough but no shortness of breath.  REVIEW OF SYSTEMS:   ROS Hard of hearing. DRUG ALLERGIES:  No Known Allergies  VITALS:  Blood pressure 125/62, pulse 90, temperature (!) 97.4 F (36.3 C), temperature source Oral, resp. rate 19, height 5\' 8"  (1.727 m), weight 98.6 kg, SpO2 92 %.  PHYSICAL EXAMINATION:  GENERAL:  78 y.o.-year-old patient lying in bed, noted to have some cough, mild shortness of breath extubated. EYES: Pupils equal, round, reactive to light No scleral icterus.  HEENT: Head atraumatic, normocephalic.  Marland Kitchen  NECK:   no jugular venous distention. No thyroid enlargement, no tenderness.  LUNGS: Diminished breath sound bilaterally.    CARDIOVASCULAR: S1, S2 normal. No murmurs, rubs, or gallops.  ABDOMEN: Soft, , nondistended. Bowel sounds present. No organomegaly or mass.  EXTREMITIES: No pedal edema, cyanosis, or clubbing.  NEUROLOGIC: Extubated, following commands, no gross new focal neurological deficit.  Cranial nerves II to XII intact, 5/5 left upper and lower extremities.  PSYCHIATRIC: Alert, awake, oriented but very hard of hearing  LABORATORY PANEL:   CBC Recent Labs  Lab 10/29/18 0726  WBC 10.0  HGB 13.0  HCT 43.4  PLT 297   ------------------------------------------------------------------------------------------------------------------  Chemistries  Recent Labs  Lab 10/28/18 0418  10/29/18 0726  NA 141  --  140  K 5.7*   < > 5.4*  CL 95*  --  93*  CO2 37*  --  37*  GLUCOSE 86  --  129*  BUN 46*  --  38*  CREATININE 1.29*  --  1.02  CALCIUM 8.0*  --  8.3*  MG 2.6*  --    --    < > = values in this interval not displayed.   ------------------------------------------------------------------------------------------------------------------  Cardiac Enzymes No results for input(s): TROPONINI in the last 168 hours. ------------------------------------------------------------------------------------------------------------------  RADIOLOGY:  No results found.  EKG:   Orders placed or performed during the hospital encounter of 11/04/2018  . EKG 12-Lead  . EKG 12-Lead    ASSESSMENT AND PLAN:  #1. severe acute respiratory failure with hypoxia, hypercapnia secondary to COPD exacerbation, pneumonia, extubated, repeat chest x-ray ordered.  Continue antibiotics, bronchodilators, steroids.\\   2.Marland Kitchen acute kidney injury,; improved,   3.Hyperkalemia improving, continue low-dose Lokelma.  4..   UTI, patient already on antibiotics follow urine cultures.   already is on Rocephin.   5. acute respiratory failure due to combination of CHF and pneumonia  Echocardiogram showed EF more than 55%. Acute on chronic diastolic heart failure, patient on IV Lasix, transition to oral Lasix today. #6 dysphagia, history of hiatal hernia, patient seen by speech therapy, started on dysphagia 2 diet, patient daughter is interested in getting repeat MBS done, previous MBS in 2014 showed large hiatal hernia with mild reflux.  Patient cough is likely secondary to pneumonia/hiatal hernia as well.  #7.. condition guarded,  DVT, GI prophylaxis. Called patient's daughter Butch Penny and left a voice message  All the records are reviewed and case discussed with Care Management/Social Workerr. Management plans discussed with the patient, family and they are in agreement.  CODE STATUS:  Full code  TOTAL TIME TAKING CARE OF THIS PATIENT: 35 minutes.   POSSIBLE D/C IN 1-2 DAYS, DEPENDING ON CLINICAL CONDITION.   Katha HammingSnehalatha Lindamarie Maclachlan M.D on 10/30/2018 at 1:33 PM  Between 7am to 6pm - Pager -  [phone]  After 6pm go to [email or web address] - password [email or web address] Andrew Hall  Office  [phone]  CC: Primary care physician; Tamsen Roershrismon, Dennis E, PA   Note: This dictation was prepared with Dragon dictation along with smaller phrase technology. Any transcriptional errors that result from this process are unintentional.

## 2018-10-30 NOTE — Progress Notes (Signed)
Patient seen pulling crusty material out of his nose and piling it up on the top of a styrofoam cup lid. Patient then asked if I could help pull the rest of it out. Also, patient claims there is a "buzzard" outside the window looking at him and that it always "comes to visit" when he's in the hospital. [place]

## 2018-10-30 NOTE — Plan of Care (Signed)
  Problem: Education: Goal: Knowledge of General Education information will improve Description: Including pain rating scale, medication(s)/side effects and non-pharmacologic comfort measures Outcome: Progressing   Problem: Health Behavior/Discharge Planning: Goal: Ability to manage health-related needs will improve Outcome: Progressing   Problem: Clinical Measurements: Goal: Ability to maintain clinical measurements within normal limits will improve Outcome: Not Progressing Note: Potassium level is still elevated at 5.4, thus PT didn't work with the patient today. Patient given Lokelma earlier in the shift. Will continue to monitor for new lab values. Wenda Low [facility]

## 2018-10-30 NOTE — Progress Notes (Signed)
PT Cancellation Note  Patient Details Name: Andrew Hall MRN: [id number] DOB: [birth year]   Cancelled Treatment:    Reason Eval/Treat Not Completed: Medical issues which prohibited therapy. Per chart review and secure chart with nursing pt has elevated potassium outside of APTA Guidelines for Activity and is not appropriate for PT at this time. PT will follow up as able and pt more medically appropriate.   Zachary George PT, DPT 9:13 AM,10/30/18 [phone]

## 2018-10-30 NOTE — Progress Notes (Signed)
Informed Dr. Vianne Bulls via text page about patient's apparent "new rash" on L arm. To me it appears to be a large bruise, just like the ones he has on his R arm. Will continue to monitor. Of note, did NOT infuse antibiotic through L arm IV earlier in shift. Wenda Low [facility]

## 2018-10-31 DIAGNOSIS — Z4659 Encounter for fitting and adjustment of other gastrointestinal appliance and device: Secondary | ICD-10-CM

## 2018-10-31 DIAGNOSIS — Z515 Encounter for palliative care: Secondary | ICD-10-CM

## 2018-10-31 DIAGNOSIS — I509 Heart failure, unspecified: Secondary | ICD-10-CM

## 2018-10-31 DIAGNOSIS — Z7189 Other specified counseling: Secondary | ICD-10-CM

## 2018-10-31 DIAGNOSIS — R1312 Dysphagia, oropharyngeal phase: Secondary | ICD-10-CM

## 2018-10-31 DIAGNOSIS — R092 Respiratory arrest: Secondary | ICD-10-CM

## 2018-10-31 LAB — CULTURE, BLOOD (ROUTINE X 2)
Culture: NO GROWTH
Culture: NO GROWTH

## 2018-10-31 LAB — BASIC METABOLIC PANEL
Anion gap: 10 (ref 5–15)
BUN: 36 mg/dL — ABNORMAL HIGH (ref 8–23)
CO2: 38 mmol/L — ABNORMAL HIGH (ref 22–32)
Calcium: 8.7 mg/dL — ABNORMAL LOW (ref 8.9–10.3)
Chloride: 91 mmol/L — ABNORMAL LOW (ref 98–111)
Creatinine, Ser: 1.06 mg/dL (ref 0.61–1.24)
GFR calc Af Amer: >60 mL/min (ref >60)
GFR calc non Af Amer: >60 mL/min (ref >60)
Glucose, Bld: 132 mg/dL — ABNORMAL HIGH (ref 70–99)
Potassium: 5.1 mmol/L (ref 3.5–5.1)
Sodium: 139 mmol/L (ref 135–145)

## 2018-10-31 LAB — BLOOD GAS, ARTERIAL
Acid-Base Excess: 21.4 mmol/L — ABNORMAL HIGH (ref 0.0–2.0)
Bicarbonate: 52 mmol/L — ABNORMAL HIGH (ref 20.0–28.0)
O2 Saturation: 96.7 %
Patient temperature: 37
pCO2 arterial: 90 mmHg (ref 32.0–48.0)
pH, Arterial: 7.37 (ref 7.350–7.450)
pO2, Arterial: 90 mmHg (ref 83.0–108.0)

## 2018-10-31 LAB — GLUCOSE, CAPILLARY
Glucose-Capillary: 108 mg/dL — ABNORMAL HIGH (ref 70–99)
Glucose-Capillary: 118 mg/dL — ABNORMAL HIGH (ref 70–99)

## 2018-10-31 LAB — CBC
HCT: 46.5 % (ref 39.0–52.0)
Hemoglobin: 13.6 g/dL (ref 13.0–17.0)
MCH: 27.1 pg (ref 26.0–34.0)
MCHC: 29.2 g/dL — ABNORMAL LOW (ref 30.0–36.0)
MCV: 92.8 fL (ref 80.0–100.0)
Platelets: 307 10*3/uL (ref 150–400)
RBC: 5.01 MIL/uL (ref 4.22–5.81)
RDW: 16.3 % — ABNORMAL HIGH (ref 11.5–15.5)
WBC: 8.9 10*3/uL (ref 4.0–10.5)
nRBC: 0 % (ref 0.0–0.2)

## 2018-10-31 MED ORDER — IPRATROPIUM-ALBUTEROL 0.5-2.5 (3) MG/3ML IN SOLN
3.0000 mL | RESPIRATORY_TRACT | Status: DC
  Administered 2018-10-31: 20:00:00 3 mL via RESPIRATORY_TRACT
  Filled 2018-10-31: qty 3

## 2018-10-31 MED ORDER — MORPHINE 100MG IN NS 100ML (1MG/ML) PREMIX INFUSION
0.0000 mg/h | INTRAVENOUS | Status: DC
  Administered 2018-10-31: 5 mg/h via INTRAVENOUS
  Filled 2018-10-31: qty 100

## 2018-10-31 MED ORDER — MIDAZOLAM HCL 2 MG/2ML IJ SOLN: 2.0000 mg | INTRAMUSCULAR | Status: DC | PRN

## 2018-10-31 MED ORDER — ACETAMINOPHEN 650 MG RE SUPP: 650.0000 mg | Freq: Four times a day (QID) | RECTAL | Status: DC | PRN

## 2018-10-31 MED ORDER — DIPHENHYDRAMINE HCL 50 MG/ML IJ SOLN: 25.0000 mg | INTRAMUSCULAR | Status: DC | PRN

## 2018-10-31 MED ORDER — DEXTROSE 5 % IV SOLN: INTRAVENOUS | Status: DC

## 2018-10-31 MED ORDER — GLYCOPYRROLATE 1 MG PO TABS
1.0000 mg | ORAL_TABLET | ORAL | Status: DC | PRN
  Filled 2018-10-31: qty 1

## 2018-10-31 MED ORDER — GLYCOPYRROLATE 0.2 MG/ML IJ SOLN: 0.2000 mg | INTRAMUSCULAR | Status: DC | PRN

## 2018-10-31 MED ORDER — POLYVINYL ALCOHOL 1.4 % OP SOLN
1.0000 [drp] | Freq: Four times a day (QID) | OPHTHALMIC | Status: DC | PRN
  Filled 2018-10-31: qty 15

## 2018-10-31 MED ORDER — MORPHINE BOLUS VIA INFUSION
5.0000 mg | INTRAVENOUS | Status: DC | PRN
  Administered 2018-10-31 – 2018-11-01 (×2): 5 mg via INTRAVENOUS
  Filled 2018-10-31: qty 5

## 2018-10-31 MED ORDER — CHLORHEXIDINE GLUCONATE CLOTH 2 % EX PADS
6.0000 | MEDICATED_PAD | Freq: Every day | CUTANEOUS | Status: DC
  Administered 2018-10-31: 10:00:00 6 via TOPICAL

## 2018-10-31 MED ORDER — MORPHINE SULFATE (PF) 2 MG/ML IV SOLN
INTRAVENOUS | Status: AC
  Administered 2018-10-31: 2 mg via INTRAVENOUS
  Filled 2018-10-31: qty 1

## 2018-10-31 MED ORDER — MORPHINE SULFATE (PF) 2 MG/ML IV SOLN
2.0000 mg | Freq: Once | INTRAVENOUS | Status: AC
  Administered 2018-10-31: 18:00:00 2 mg via INTRAVENOUS

## 2018-10-31 MED ORDER — FUROSEMIDE 10 MG/ML IJ SOLN: 40.0000 mg | Freq: Every day | INTRAMUSCULAR | Status: DC

## 2018-10-31 MED ORDER — ACETAMINOPHEN 325 MG PO TABS: 650.0000 mg | ORAL_TABLET | Freq: Four times a day (QID) | ORAL | Status: DC | PRN

## 2018-10-31 MED ORDER — FUROSEMIDE 10 MG/ML IJ SOLN
40.0000 mg | Freq: Four times a day (QID) | INTRAMUSCULAR | Status: DC
  Administered 2018-10-31: 04:00:00 40 mg via INTRAVENOUS
  Filled 2018-10-31: qty 4

## 2018-10-31 MED ORDER — GLYCOPYRROLATE 0.2 MG/ML IJ SOLN
0.2000 mg | INTRAMUSCULAR | Status: DC | PRN
  Administered 2018-10-31 – 2018-11-01 (×2): 0.2 mg via INTRAVENOUS
  Filled 2018-10-31: qty 1

## 2018-10-31 MED ORDER — FUROSEMIDE 10 MG/ML IJ SOLN: 40.0000 mg | Freq: Four times a day (QID) | INTRAMUSCULAR | Status: DC

## 2018-10-31 MED ORDER — MORPHINE SULFATE (PF) 2 MG/ML IV SOLN: 2.0000 mg | INTRAVENOUS | Status: DC | PRN

## 2018-10-31 NOTE — Plan of Care (Signed)
  Problem: Clinical Measurements: Goal: Will remain free from infection Outcome: Progressing Goal: Respiratory complications will improve Outcome: Progressing   Problem: Safety: Goal: Ability to remain free from injury will improve Outcome: Progressing   

## 2018-10-31 NOTE — Progress Notes (Signed)
HFNC applied on arrival to ICU 19, pt very lethargic, non purposeful, rouses to noxious stimuli, does not open eyes.  Daughter aware, she is en route to visit, she confirms he vomited last nite on 2A and is concerned he may have aspirated.  Dr Mortimer Fries aware of patient's current status; he has placede pall consult.

## 2018-10-31 NOTE — Progress Notes (Signed)
Patients oxygen saturation decreased to 79% while on breathing treatment. Fritz Pickerel from RT and MD notified. Blood gas was drawn. Preparing for ICU transfer. MD will notify patients daughter.

## 2018-10-31 NOTE — Consult Note (Signed)
Melodie Bouillon, MD [address], [address], [place], [place], [address] [address], [place], [place], [address] Phone: [phone]  Fax: [phone]  Consultation  Referring Provider:     Dr. Belia Heman Primary Care Physician:  Tamsen Roers, [place] Reason for Consultation:     Feeding tube consult  Date of Admission:  11-23-2018 Date of Consultation:  10/31/2018         HPI:   Andrew Hall is a 78 y.o. male with history of COPD admitted with hypoxia, intubated, extubated, now on high flow cannula with daughter at bedside.  Patient is hard of hearing but understands what you are talking about.  Speech pathology evaluated him and cleared him for dysphagia level 2 diet with strict reflux precautions.  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 Skeet Simmer in the past.  There is a procedure report available from 2012 in provation.  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  Gastric erythema was also reported.  Patient's daughter also has a 2014 report that she placed in the paper chart, done at [facility] by Dr. Skeet Simmer that reports a small hiatal hernia, and a Schatzki's ring that was dilated but just passing the scope through the area and a tear seen after the scope passed through the area.  The large hiatal hernia mentioned in the speech pathology note was seen on a barium swallow in February 2014 and reported a large hiatal hernia.  Patient's daughter states when he swallows it does not seem to get hung, he swallows it okay, but sometimes has regurgitation of food.  The patient's history is somewhat unreliable, but he feels that the food may get hung when he swallows as well.  History reviewed. No pertinent past medical history.  Past Surgical History:   Procedure Laterality Date  . HERNIA REPAIR Bilateral 08/08/2010    Prior to Admission medications   Medication Sig Start Date End Date Taking? Authorizing Provider  albuterol (VENTOLIN HFA) 108 (90 Base) MCG/ACT inhaler Inhale 2 puffs into the lungs every 6 (six) hours as needed for wheezing or shortness of breath. 04/13/17  Yes Chrismon, Jodell Cipro, PA  amLODipine (NORVASC) 5 MG tablet Take 1 tablet (5 mg total) by mouth daily. 05/23/18  Yes Chrismon, Jodell Cipro, PA  B Complex Vitamins (VITAMIN B COMPLEX PO) Take by mouth daily.   Yes [provider]  clonazePAM (KLONOPIN) 0.5 MG tablet Take 1 tablet (0.5 mg total) by mouth at bedtime. 09/22/18  Yes Chrismon, Jodell Cipro, PA  fenofibrate 160 MG tablet Take 1 tablet (160 mg total) by mouth daily. 08/22/18  Yes Chrismon, Jodell Cipro, PA  Fexofenadine HCl (MUCINEX ALLERGY PO) Take 1 tablet by mouth daily.   Yes [provider]  Fluticasone-Umeclidin-Vilant (TRELEGY ELLIPTA) 100-62.5-25 MCG/INH AEPB Inhale 1 puff into the lungs daily. 03/30/17  Yes Chrismon, Jodell Cipro, PA  MEGARED OMEGA-3 KRILL OIL PO Take 1 tablet by mouth daily.   Yes [provider]  sertraline (ZOLOFT) 50 MG tablet Take 1 tablet (50 mg total) by mouth at bedtime. 07/25/18  Yes Chrismon, Jodell Cipro, PA    Family History  Problem Relation Age of Onset  . Heart disease Mother      Social History   Tobacco Use  .  Smoking status: Former Smoker    Types: Cigarettes  . Smokeless tobacco: Never Used  . Tobacco comment: quit in 2012  Substance Use Topics  . Alcohol use: No    Alcohol/week: 0.0 standard drinks    Comment: stopped in 2012  . Drug use: No    Allergies as of 05-21-2018  . (No Known Allergies)    Review of Systems:    All systems reviewed and negative except where noted in HPI.   Physical Exam:  Vital signs in last 24 hours: Vitals:   10/31/18 1300 10/31/18 1400 10/31/18 1500 10/31/18 1600  BP: 140/74 (!) 133/59 132/64 (!) 141/80  Pulse: 86  (!) 106 (!) 108 96  Resp: (!) 29 (!) 37 (!) 36 19  Temp:    98.3 F (36.8 C)  TempSrc:    Axillary  SpO2: 93% 90% 93% (!) 89%  Weight:      Height:       Last BM Date: 10/30/18 General:   Pleasant, cooperative in NAD Head:  Normocephalic and atraumatic. Eyes:   No icterus.   Conjunctiva pink. PERRLA. Ears:  Normal auditory acuity. Neck:  Supple; no masses or thyroidomegaly Lungs: Respirations even and unlabored. Lungs clear to auscultation bilaterally.   No wheezes, crackles, or rhonchi.  Abdomen:  Soft, nondistended, nontender. Normal bowel sounds. No appreciable masses or hepatomegaly.  No rebound or guarding.  Neurologic:  Alert and oriented x3;  grossly normal neurologically. Skin:  Intact without significant lesions or rashes. Cervical Nodes:  No significant cervical adenopathy. Psych:  Alert and cooperative. Normal affect.  LAB RESULTS: Recent Labs    10/29/18 0726 10/31/18 0505  WBC 10.0 8.9  HGB 13.0 13.6  HCT 43.4 46.5  PLT 297 307   BMET Recent Labs    10/29/18 0726 10/31/18 0505  NA 140 139  K 5.4* 5.1  CL 93* 91*  CO2 37* 38*  GLUCOSE 129* 132*  BUN 38* 36*  CREATININE 1.02 1.06  CALCIUM 8.3* 8.7*   LFT No results for input(s): PROT, ALBUMIN, AST, ALT, ALKPHOS, BILITOT, BILIDIR, IBILI in the last 72 hours. PT/INR No results for input(s): LABPROT, INR in the last 72 hours.  STUDIES: Dg Chest Port 1 View  Result Date: 10/30/2018 CLINICAL DATA:  Shortness of breath. Severe acute respiratory failure. Hypoxia. Hypercapnia. COPD. Ex-smoker. EXAM: PORTABLE CHEST 1 VIEW COMPARISON:  05-21-2018 FINDINGS: Stable enlarged cardiac silhouette. Decreased patchy opacity in the left mid lung zone. Decreased bibasilar opacity. Stable prominence of the pulmonary vasculature and interstitial markings. No pleural fluid seen. Thoracic spine degenerative changes. IMPRESSION: 1. Decreased left mid lung zone pneumonia. 2. Decreased bibasilar atelectasis and possible  pneumonia. 3. Stable cardiomegaly, pulmonary vascular congestion and chronic interstitial lung disease. Electronically Signed   By: Beckie SaltsSteven  Reid M.D.   On: 10/30/2018 14:07      Impression / Plan:   Andrew Hall is a 78 y.o. y/o male with COPD, hypoxia on presentation, intubated, now extubated, on high flow oxygen at this time with GI consulted for feeding tube placement  Given patient's oxygen requirements, endoscopy with sedation is high risk at this time for this patient  However, once clinical condition improves from hypoxia standpoint and oxygen requirements, endoscopy can be considered at that time  Unclear if his symptoms are occurring from a Schatzki's ring or oropharyngeal dysphagia itself.  The way to evaluate both for Schatzki's ring and evaluate the status of previously seen hiatal hernia noninvasively, would be an upper GI  study.  However, there may be aspiration risk from the contrast during the study as well.  Therefore, alternative would be CT with IV contrast to at least evaluate if the hiatal hernia is large at this point, which would make endoscopically placed PEG tube difficult.  However, either an upper GI series or a CT scan, can be done once his clinical status improves, because endoscopy for feeding tube placement would be too high risk in his clinical condition at this time anyway.  Daughter understands these risks and does not want to put the patient through a high risk endoscopy with sedation given his oxygen requirements as well.  Consultation with interventional radiology is also an option and was discussed with patient, daughter and Dr. Mortimer Fries. If a feeding tube placement is needed at this time, before patient can be stabilized for endoscopy over the next days or weeks interventional radiology feeding tube may be a safer option for the patient.  However, she does state that she is not sure if patient will comply with not eating by mouth even if a feeding tube is  placed.  Therefore, family would need to make a decision about feeding tube placement via any modality  because if patient is going to continue to eat by mouth even if he has aspiration risks with an oral diet, then that would defeat the purpose of a feeding tube altogether.  Dr. Allen Norris will be taking over the service tomorrow  Thank you for involving me in the care of this patient.      LOS: 5 days   Virgel Manifold, MD  10/31/2018, 4:27 PM

## 2018-10-31 NOTE — Progress Notes (Signed)
Spoke with pts family (2 daughters and 1 son at bedside) per pts daughter request regarding plan of care.  After answering all of their questions they confirmed they would like to transition pt to comfort measures only.  Marda Stalker, [place] Pager [phone] (please enter [phone] digits) PCCM Consult Pager [phone] (please enter 7 digits)

## 2018-10-31 NOTE — Progress Notes (Signed)
[facility] [place] NAME: Andrew Hall    MR#:  [id number]  DATE OF BIRTH:  1940/09/11  Patient was alert yesterday sitting in the chair but noted to have hypoxia early this morning, was on BiPAP, he is very lethargic now, O2 sats in 70s on 5 L, patient started on BiPAP, will get ABG, transfer the patient to CCU, spoke with Dr. Dorise Hiss.  CHIEF COMPLAINT:   Chief Complaint  Patient presents with  . Weakness  . Fall  Arousable but drifting back to sleep.  REVIEW OF SYSTEMS:   ROS Unable to obtain review of systems because of lethargy. DRUG ALLERGIES:  No Known Allergies  VITALS:  Blood pressure (!) 95/49, pulse 91, temperature 98.8 F (37.1 C), temperature source Axillary, resp. rate (!) 24, height 5\' 8"  (1.727 m), weight 91 kg, SpO2 100 %.  PHYSICAL EXAMINATION:  GENERAL:  78 y.o.-year-old patient lying in bed, noted to have some cough, mild shortness of breath extubated. EYES: Pupils equal, round, reactive to light No scleral icterus.  HEENT: Head atraumatic, normocephalic.  Marland Kitchen  NECK:   no jugular venous distention. No thyroid enlargement, no tenderness.  LUNGS: Diminished breath sound bilaterally.    CARDIOVASCULAR: S1, S2 normal. No murmurs, rubs, or gallops.  ABDOMEN: Soft, , nondistended. Bowel sounds present. No organomegaly or mass.  EXTREMITIES: No pedal edema, cyanosis, or clubbing.  NEUROLOGIC: Extubated, following commands, no gross new focal neurological deficit.  Cranial nerves II to XII intact, 5/5 left upper and lower extremities.  PSYCHIATRIC: Alert, awake, oriented but very hard of hearing  LABORATORY PANEL:   CBC Recent Labs  Lab 10/31/18 0505  WBC 8.9  HGB 13.6  HCT 46.5  PLT 307   ------------------------------------------------------------------------------------------------------------------  Chemistries  Recent Labs  Lab 10/28/18 0418  10/31/18 0505  NA 141   < > 139  K 5.7*   < > 5.1   CL 95*   < > 91*  CO2 37*   < > 38*  GLUCOSE 86   < > 132*  BUN 46*   < > 36*  CREATININE 1.29*   < > 1.06  CALCIUM 8.0*   < > 8.7*  MG 2.6*  --   --    < > = values in this interval not displayed.   ------------------------------------------------------------------------------------------------------------------  Cardiac Enzymes No results for input(s): TROPONINI in the last 168 hours. ------------------------------------------------------------------------------------------------------------------  RADIOLOGY:  Dg Chest Port 1 View  Result Date: 10/30/2018 CLINICAL DATA:  Shortness of breath. Severe acute respiratory failure. Hypoxia. Hypercapnia. COPD. Ex-smoker. EXAM: PORTABLE CHEST 1 VIEW COMPARISON:  11-17-2018 FINDINGS: Stable enlarged cardiac silhouette. Decreased patchy opacity in the left mid lung zone. Decreased bibasilar opacity. Stable prominence of the pulmonary vasculature and interstitial markings. No pleural fluid seen. Thoracic spine degenerative changes. IMPRESSION: 1. Decreased left mid lung zone pneumonia. 2. Decreased bibasilar atelectasis and possible pneumonia. 3. Stable cardiomegaly, pulmonary vascular congestion and chronic interstitial lung disease. Electronically Signed   By: Claudie Revering M.D.   On: 10/30/2018 14:07    EKG:   Orders placed or performed during the hospital encounter of 11-17-18  . EKG 12-Lead  . EKG 12-Lead    ASSESSMENT AND PLAN:  #1.  Recurrent acute respiratory failure with hypoxia, hypercapnia, patient initially admitted to ICU, intubated, extubated again developed respiratory distress since early this morning and very lethargic now, will do ABG, start on BiPAP, will transfer the patient to CCU stepdown, spoke with  Dr. Santiago Gladavid Kasa. Recurrent respiratory failure likely aspiration event, daughter mentioned that patient vomited last night and started to have respiratory distress this morning, patient daughter [name] told me that he had EGD  recently but I do not see anything in care everywhere, asking for the report.  Appreciate speech following.  2.. acute kidney injury,; improved,   3.Hyperkalemia improving, continue low-dose Lokelma.  4..   UTI, patient already on antibiotics follow urine cultures.   already is on Rocephin.   5. acute respiratory failure due to combination of CHF and pneumonia  Echocardiogram showed EF more than 55%.,  Continue IV Lasix, patient received extra dose of IV Lasix yesterday.   Ac#7.. condition guarded,     DVT, GI prophylaxis. Spoke with patient's daughter Martie LeeSabrina explained that patient respiratory status is worse again, patient is going to ICU.   All the records are reviewed and case discussed with Care Management/Social Workerr. Management plans discussed with the patient, family and they are in agreement.  CODE STATUS: Full code  TOTAL TIME TAKING CARE OF THIS PATIENT: 35 minutes.   POSSIBLE D/C IN 1-2 DAYS, DEPENDING ON CLINICAL CONDITION.   Andrew HammingSnehalatha Abby Hall M.D on 10/31/2018 at 8:58 AM  Between 7am to 6pm - Pager - [phone]  After 6pm go to [email or web address] - password [email or web address] [facility]  [facility]  Office  [phone]  CC: Primary care physician; Tamsen Roershrismon, Dennis E, PA   Note: This dictation was prepared with Dragon dictation along with smaller phrase technology. Any transcriptional errors that result from this process are unintentional.

## 2018-10-31 NOTE — Consult Note (Addendum)
Consultation Note Date: 10/31/2018   Patient Name: Andrew Hall  DOB: [birth year]  MRN: [id number]  Age / Sex: 78 y.o., male  PCP: Chrismon, Vickki Muff, PA Referring Physician: Epifanio Lesches, MD  Reason for Consultation: Establishing goals of care  HPI/Patient Profile: Andrew Hall  is a 78 y.o. male with a known history of COPD, anxiety comes in because fall in the bathroom and brought in here and found to have hypoxia with oxygen saturation 71% on room air, patient received multiple dose of nebulizers, steroids, Lasix in the emergency room,. Patient has a large hiatal hernia. He had an aspiration event last night.   Clinical Assessment and Goals of Care: Patient is sitting in bed on high flow cannula. CCM is having GOC with one of patient's children on the phone. A daughter is present at bedside. She states this is difficult and her. She states she understands her father will not get better, but wants him to make the decisions.   Spoke with patient. He is winded, and answers questions selectively, but states he does not feel like he is suffering. He wants to continue any possible care. Discussed code status and ventilator support, however patient did not have a response for these questions. His daughter is concerned that she does not know his religious beliefs as the chaplain inquired about this previously. He states to her he believes in God and heaven, and this relieves her. Daughter wants to honor her father's wishes. GI in to see patient.   Will continue to follow.        SUMMARY OF RECOMMENDATIONS   Palliative will continue to follow.   Prognosis:   Poor overall  Discharge Planning: To Be Determined      Primary Diagnoses: Present on Admission: . Acute respiratory failure with hypoxemia ([facility]) . Acute respiratory failure with hypoxia and hypercapnia ([facility]) . Acute respiratory failure  ([place])   I have reviewed the medical record, interviewed the patient and family, and examined the patient. The following aspects are pertinent.  History reviewed. No pertinent past medical history. Social History   Socioeconomic History  . Marital status: Married    Spouse name: Not on file  . Number of children: 3  . Years of education: Not on file  . Highest education level: 9th grade  Occupational History  . Occupation: retired  Scientific laboratory technician  . Financial resource strain: Not hard at all  . Food insecurity    Worry: Never true    Inability: Never true  . Transportation needs    Medical: No    Non-medical: No  Tobacco Use  . Smoking status: Former Smoker    Types: Cigarettes  . Smokeless tobacco: Never Used  . Tobacco comment: quit in 2012  Substance and Sexual Activity  . Alcohol use: No    Alcohol/week: 0.0 standard drinks    Comment: stopped in 2012  . Drug use: No  . Sexual activity: Not on file  Lifestyle  . Physical activity    Days per  week: Not on file    Minutes per session: Not on file  . Stress: Not at all  Relationships  . Social Musician on phone: Not on file    Gets together: Not on file    Attends religious service: Not on file    Active member of club or organization: Not on file    Attends meetings of clubs or organizations: Not on file    Relationship status: Not on file  Other Topics Concern  . Not on file  Social History Narrative  . Not on file   Family History  Problem Relation Age of Onset  . Heart disease Mother    Scheduled Meds: . Chlorhexidine Gluconate Cloth  6 each Topical Daily  . Chlorhexidine Gluconate Cloth  6 each Topical Daily  . docusate  100 mg Per Tube BID  . enoxaparin (LOVENOX) injection  40 mg Subcutaneous Q24H  . fexofenadine  180 mg Oral Daily  . fluticasone furoate-vilanterol  1 puff Inhalation Daily   And  . umeclidinium bromide  1 puff Inhalation Daily  . [START ON 11/04/2018] furosemide  40 mg  Intravenous Daily  . ipratropium-albuterol  3 mL Nebulization Q4H  . mouth rinse  15 mL Mouth Rinse BID  . methylPREDNISolone (SOLU-MEDROL) injection  60 mg Intravenous Q24H  . pantoprazole  40 mg Oral Daily  . sertraline  50 mg Oral QHS  . sodium zirconium cyclosilicate  10 g Oral Daily   Continuous Infusions: PRN Meds:.acetaminophen **OR** acetaminophen, ALPRAZolam, bisacodyl, ondansetron **OR** ondansetron (ZOFRAN) IV, traZODone Medications Prior to Admission:  Prior to Admission medications   Medication Sig Start Date End Date Taking? Authorizing Provider  albuterol (VENTOLIN HFA) 108 (90 Base) MCG/ACT inhaler Inhale 2 puffs into the lungs every 6 (six) hours as needed for wheezing or shortness of breath. 04/13/17  Yes Chrismon, Jodell Cipro, PA  amLODipine (NORVASC) 5 MG tablet Take 1 tablet (5 mg total) by mouth daily. 05/23/18  Yes Chrismon, Jodell Cipro, PA  B Complex Vitamins (VITAMIN B COMPLEX PO) Take by mouth daily.   Yes [provider]  clonazePAM (KLONOPIN) 0.5 MG tablet Take 1 tablet (0.5 mg total) by mouth at bedtime. 09/22/18  Yes Chrismon, Jodell Cipro, PA  fenofibrate 160 MG tablet Take 1 tablet (160 mg total) by mouth daily. 08/22/18  Yes Chrismon, Jodell Cipro, PA  Fexofenadine HCl (MUCINEX ALLERGY PO) Take 1 tablet by mouth daily.   Yes [provider]  Fluticasone-Umeclidin-Vilant (TRELEGY ELLIPTA) 100-62.5-25 MCG/INH AEPB Inhale 1 puff into the lungs daily. 03/30/17  Yes Chrismon, Jodell Cipro, PA  MEGARED OMEGA-3 KRILL OIL PO Take 1 tablet by mouth daily.   Yes [provider]  sertraline (ZOLOFT) 50 MG tablet Take 1 tablet (50 mg total) by mouth at bedtime. 07/25/18  Yes Chrismon, Jodell Cipro, PA   No Known Allergies Review of Systems  All other systems reviewed and are negative.   Physical Exam Pulmonary:     Comments: High flow cannula Neurological:     Mental Status: He is alert.     Vital Signs: BP 132/64   Pulse (!) 108   Temp 97.8 F (36.6 C)  (Axillary)   Resp (!) 36   Ht 6' (1.829 m)   Wt 89.4 kg   SpO2 93%   BMI 26.73 kg/m  Pain Scale: CPOT   Pain Score: Asleep   SpO2: SpO2: 93 % O2 Device:SpO2: 93 % O2 Flow Rate: .O2 Flow Rate (L/min):  50 L/min  IO: Intake/output summary:   Intake/Output Summary (Last 24 hours) at 10/31/2018 1514 Last data filed at 10/31/2018 1300 Gross per 24 hour  Intake -  Output 1100 ml  Net -1100 ml    LBM: Last BM Date: 10/30/18 Baseline Weight: Weight: 90.7 kg Most recent weight: Weight: 89.4 kg     Palliative Assessment/Data:     Time In: 3:00 Time Out: 3:20 Time Total: 40 min Greater than 50%  of this time was spent counseling and coordinating care related to the above assessment and plan.  Signed by: Morton Stallrystal Christiaan Strebeck, NP   Please contact Palliative Medicine Team phone at [phone] for questions and concerns.  For individual provider: See Loretha StaplerAmion

## 2018-10-31 NOTE — Progress Notes (Signed)
Patient was transferred to ICU rm 19. Gave bedside report to [facility]. Returned daughters phone call and was not able to leave a message due to mailbox being full.

## 2018-10-31 NOTE — Progress Notes (Addendum)
PT supposedly work with pt yesterday but potassium was not recheck on this pt. Notify prime and talked to dr. Jannifer Franklin and place order to check CBC and BMP this morning. Will continue to monitor.  Update 0310: Pt have increase work of breathing. On assessment pt oxygen was at 74 on 3 liters oxygen. Pt was bump up to 5 liter and pt now sating at 90%. Pt lung sounds was diminished rhonci. Page prime. Will continue to monitor.  Update 0335: Talked to Dr. Marcille Blanco and states will place order. Will continue to monitor.   Update 0340: Laix IV 40 mg was given and pt was placed back on BIPAP. Pt sat is now at 100 %. Will continue to monitor.

## 2018-10-31 NOTE — Progress Notes (Signed)
SLP Cancellation Note  Patient Details Name: BREVIN MCFADDEN MRN: [id number] DOB: [birth year]   Cancelled treatment:       Reason Eval/Treat Not Completed: Patient not medically ready;Medical issues which prohibited therapy(chart reviewed; pt being transferred to CCU ). ST services will f/u tomorrow. NSG/MD updated on pt's baseline status of Esophageal dysmotility concerns possibly impacting overall status.     Orinda Kenner, MS, CCC-SLP Draven Natter 10/31/2018, 10:46 AM

## 2018-10-31 NOTE — Progress Notes (Signed)
PT Cancellation Note  Patient Details Name: Andrew Hall MRN: [id number] DOB: [birth year]   Cancelled Treatment:    Reason Eval/Treat Not Completed: Medical issues which prohibited therapy.  PT consult received.  Chart reviewed.  Pt transferred to CCU this morning and per chart pt very lethargic this morning; continue at transfer PT order received.  Will hold PT at this time until pt is medically appropriate to participate in physical therapy.  Leitha Bleak, PT 10/31/18, 12:27 PM [phone]

## 2018-10-31 NOTE — Progress Notes (Signed)
Family At bedside, clinical status relayed to family  Updated and notified of patients medical condition-  Progressive multiorgan failure with very low chance of meaningful recovery.  Patient is in dying  Process. Patient with severe hypoxia and increased WOB On BIPAP but is failing, Daughter at bedside witnessed this demise after he ate some apple sauce.   Patient is suffering  Family understands the situation.  They have consented and agreed to DNR/DNI and would like to proceed with Comfort care measures.   Family are satisfied with Plan of action and management. All questions answered  Corrin Parker, M.D.  [facility]  Medical Director [place] Director [facility] Cardio-Pulmonary Department

## 2018-10-31 NOTE — Progress Notes (Signed)
Dr Mortimer Fries notified of increased WOB and RR and HR, pt somewhat less responsive, needing to be awakened for a response.  Rt notified also.  Pt now on BiPap and morphine ordered x 1.  Also ordered robinol for increasing rattling secretions

## 2018-11-03 ENCOUNTER — Telehealth: Payer: Self-pay | Admitting: Pulmonary Disease

## 2018-11-03 NOTE — Telephone Encounter (Signed)
Death certificate has been placed in Dr. Patsey Berthold folder.

## 2018-11-04 NOTE — Telephone Encounter (Signed)
Correction- death certificate has been placed in DK's folder.

## 2018-11-07 NOTE — Telephone Encounter (Signed)
Death certificate has been completed and placed up front for pick up. Andrew Hall is aware and voiced his understanding.

## 2018-11-24 NOTE — Death Summary Note (Signed)
DEATH SUMMARY   Patient Details  Name: Andrew Hall MRN: [id number] DOB: Feb 17, 1941  Admission/Discharge Information   Admit Date:  11-02-2018  Date of Death: Date of Death: 2018/11/08  Time of Death: Time of Death: 0123  Length of Stay: 2022-06-06  Referring Physician: Margo Common, PA   Reason(s) for Hospitalization  Fall  Diagnoses  Preliminary cause of death: Pneumonia Secondary Diagnoses (including complications and co-morbidities):  Active Problems:   Acute respiratory failure with hypoxemia (HCC)   Acute respiratory failure with hypoxia and hypercapnia (HCC)   Acute respiratory failure (HCC)   COPD exacerbation (HCC)   Recurrent aspiration pneumonia    Dysphagia secondary to large hiatal hernia    Acute renal failure with hyperkalemia    Anxiety    Urinary tract infection    Acute hypoxic hypercapnic respiratory failure secondary to AECOPD, pneumonia, and    pulmonary edema      Brief Hospital Course (including significant findings, care, treatment, and services provided and events leading to death)  Andrew Hall is a 78 y.o. year old male who presented to [facility] ER on 2018/11/02 following a fall in the bathroom at his home.  Upon arrival to the ER pt hypoxic with O2 sats in the 70's requiring multiple doses of nebulizer's and steroids.  CXR concerning for bibasilar pneumonia and pulmonary edema.  Pt initially placed on Bipap due to respiratory failure, however he later required mechanical intubation.  He was subsequently admitted to ICU for additional workup and treatment.  Pt successfully extubated on 10/27/2018 to [facility]NC.  On 10/28/2018 pts respiratory status improved and diet ordered, however pt showed signs of aspiration likely due to large hiatal hernia. Therefore, speech therapy consulted 10/28/2018 and recommended dysphagia level 2 diet with strict reflux precautions, diet tolerated well.  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 Pts respiratory status remained stable pt  transferred to [facility] unit on 10/29/2018.  On 10/31/2018 pt developed acute hypoxic respiratory failure O2 sats in the 70's likely secondary to recurrent aspiration due to large hiatal hernia requiring Bipap and transfer back to ICU.  GI consulted 10/31/2018 for feeding tube placement, however due to pts O2 requirements pt deemed too high risk for upper endoscopy at that time.  Per gastroenterologist, interventional radiology feeding tube placement could have been considered as a safer option.  However, on 10/31/2018 pts respiratory status continued to decline requiring 100% FiO2 on Bipap with development of worsening acute encephalopathy.  Therefore, ICU intensivist discussed plan of care, pts quality of life, and overall prognosis.  Following additional discussions pt transitioned per pt family request to comfort measures only on 10/31/2018.  The pt expired on November 08, 2018 at 0123 am with family at bedside.  Pertinent Labs and Studies  Significant Diagnostic Studies Dg Abdomen 1 View  Result Date: 11-02-18 CLINICAL DATA:  Orogastric tube placement. EXAM: ABDOMEN - 1 VIEW COMPARISON:  Portable chest obtained at the same time. FINDINGS: Endotracheal tube in satisfactory position. Orogastric tube extending into the stomach with the side hole mid to distal stomach and tip not included. See the portable chest report for the findings in the chest. Thoracic and upper lumbar spine degenerative changes. IMPRESSION: Orogastric tube extending into the stomach with the side hole mid to distal stomach and tip not included. Electronically Signed   By: Claudie Revering M.D.   On: 11/02/2018 12:38   Ct  Head Wo Contrast  Result Date: 10/25/2018 CLINICAL DATA:  78 year old male with head and neck injury following fall. Initial encounter. EXAM: CT HEAD WITHOUT CONTRAST CT CERVICAL SPINE WITHOUT CONTRAST TECHNIQUE:  Multidetector CT imaging of the head and cervical spine was performed following the standard protocol without intravenous contrast. Multiplanar CT image reconstructions of the cervical spine were also generated. COMPARISON:  None. FINDINGS: CT HEAD FINDINGS Brain: No evidence of acute infarction, hemorrhage, hydrocephalus, extra-axial collection or mass lesion/mass effect. Mild generalized cerebral volume loss noted. Vascular: No hyperdense vessel or unexpected calcification. Skull: Normal. Negative for fracture or focal lesion. Sinuses/Orbits: No acute finding. Other: None. CT CERVICAL SPINE FINDINGS Alignment: Normal. Skull base and vertebrae: No acute fracture. No primary bone lesion or focal pathologic process. Soft tissues and spinal canal: No prevertebral fluid or swelling. No visible canal hematoma. Disc levels: Moderate multilevel degenerative disc disease and facet arthropathy noted. Contributing to LEFT bony foraminal narrowing at C4-5 and biforaminal bony narrowing at C5-6. Upper chest: No acute abnormality Other: None IMPRESSION: 1. No evidence of acute intracranial abnormality. 2. No static evidence of acute injury to the cervical spine. Multilevel degenerative changes contributing to bony foraminal narrowing as discussed. Electronically Signed   By: Harmon PierJeffrey  Hu M.D.   On: February 12, 2019 09:30   Ct Cervical Spine Wo Contrast  Result Date: 11/16/2018 CLINICAL DATA:  78 year old male with head and neck injury following fall. Initial encounter. EXAM: CT HEAD WITHOUT CONTRAST CT CERVICAL SPINE WITHOUT CONTRAST TECHNIQUE: Multidetector CT imaging of the head and cervical spine was performed following the standard protocol without intravenous contrast. Multiplanar CT image reconstructions of the cervical spine were also generated. COMPARISON:  None. FINDINGS: CT HEAD FINDINGS Brain: No evidence of acute infarction, hemorrhage, hydrocephalus, extra-axial collection or mass lesion/mass effect. Mild generalized  cerebral volume loss noted. Vascular: No hyperdense vessel or unexpected calcification. Skull: Normal. Negative for fracture or focal lesion. Sinuses/Orbits: No acute finding. Other: None. CT CERVICAL SPINE FINDINGS Alignment: Normal. Skull base and vertebrae: No acute fracture. No primary bone lesion or focal pathologic process. Soft tissues and spinal canal: No prevertebral fluid or swelling. No visible canal hematoma. Disc levels: Moderate multilevel degenerative disc disease and facet arthropathy noted. Contributing to LEFT bony foraminal narrowing at C4-5 and biforaminal bony narrowing at C5-6. Upper chest: No acute abnormality Other: None IMPRESSION: 1. No evidence of acute intracranial abnormality. 2. No static evidence of acute injury to the cervical spine. Multilevel degenerative changes contributing to bony foraminal narrowing as discussed. Electronically Signed   By: Harmon PierJeffrey  Hu M.D.   On: February 12, 2019 09:30   Dg Chest Port 1 View  Result Date: 10/30/2018 CLINICAL DATA:  Shortness of breath. Severe acute respiratory failure. Hypoxia. Hypercapnia. COPD. Ex-smoker. EXAM: PORTABLE CHEST 1 VIEW COMPARISON:  February 12, 2019 FINDINGS: Stable enlarged cardiac silhouette. Decreased patchy opacity in the left mid lung zone. Decreased bibasilar opacity. Stable prominence of the pulmonary vasculature and interstitial markings. No pleural fluid seen. Thoracic spine degenerative changes. IMPRESSION: 1. Decreased left mid lung zone pneumonia. 2. Decreased bibasilar atelectasis and possible pneumonia. 3. Stable cardiomegaly, pulmonary vascular congestion and chronic interstitial lung disease. Electronically Signed   By: Beckie SaltsSteven  Reid M.D.   On: 10/30/2018 14:07   Dg Chest Portable 1 View  Result Date: 11/17/2018 CLINICAL DATA:  Intubated. EXAM: PORTABLE CHEST 1 VIEW COMPARISON:  Earlier today. FINDINGS: Interval endotracheal tube in satisfactory position. Stable enlarged cardiac silhouette and prominent pulmonary  vasculature and interstitial markings. Patchy opacity is again demonstrated in  the left mid lung zone. A small amount of patchy and linear density at both lung bases demonstrates mild progression. Small left pleural effusion. Thoracic spine degenerative changes. IMPRESSION: 1. Stable patchy opacity in the left mid lung zone, suspicious for pneumonia. 2. Mildly progressive bibasilar atelectasis and possible pneumonia. 3. Small left pleural effusion. 4. Stable cardiomegaly, pulmonary vascular congestion and chronic interstitial lung disease. Electronically Signed   By: Beckie Salts M.D.   On: 11/18/2018 12:37   Dg Chest Portable 1 View  Result Date: 10/29/2018 CLINICAL DATA:  Acute shortness of breath EXAM: PORTABLE CHEST 1 VIEW COMPARISON:  04/12/2017 FINDINGS: Bilateral airspace opacities are noted, greatest within the LEFT mid lung. Cardiomegaly is present. No pleural effusion or pneumothorax. No acute bony abnormalities are identified. IMPRESSION: Bilateral airspace opacities, LEFT-greater-than-RIGHT. Question asymmetric edema and/or infection. Electronically Signed   By: Harmon Pier M.D.   On: 10/28/2018 08:31   Dg Abd Portable 1v  Result Date: 11/10/2018 CLINICAL DATA:  Encounter for OG tube placement. EXAM: PORTABLE ABDOMEN - 1 VIEW COMPARISON:  None. FINDINGS: NG tube extends the stomach. Side port beneath the GE junction. LEFT lower lobe airspace disease. IMPRESSION: NG tube in stomach. Electronically Signed   By: Genevive Bi M.D.   On: 11/04/2018 15:18    Microbiology Recent Results (from the past 240 hour(s))  SARS Coronavirus 2 [facility] order, Performed in [facility] hospital lab) Nasopharyngeal Nasopharyngeal Swab     Status: None   Collection Time: 11/18/2018  7:57 AM   Specimen: Nasopharyngeal Swab  Result Value Ref Range Status   SARS Coronavirus 2 NEGATIVE NEGATIVE Final    Comment: (NOTE) If result is NEGATIVE SARS-CoV-2 target nucleic acids are NOT DETECTED. The SARS-CoV-2  RNA is generally detectable in upper and lower  respiratory specimens during the acute phase of infection. The lowest  concentration of SARS-CoV-2 viral copies this assay can detect is 250  copies / mL. A negative result does not preclude SARS-CoV-2 infection  and should not be used as the sole basis for treatment or other  patient management decisions.  A negative result may occur with  improper specimen collection / handling, submission of specimen other  than nasopharyngeal swab, presence of viral mutation(s) within the  areas targeted by this assay, and inadequate number of viral copies  (<250 copies / mL). A negative result must be combined with clinical  observations, patient history, and epidemiological information. If result is POSITIVE SARS-CoV-2 target nucleic acids are DETECTED. The SARS-CoV-2 RNA is generally detectable in upper and lower  respiratory specimens dur ing the acute phase of infection.  Positive  results are indicative of active infection with SARS-CoV-2.  Clinical  correlation with patient history and other diagnostic information is  necessary to determine patient infection status.  Positive results do  not rule out bacterial infection or co-infection with other viruses. If result is PRESUMPTIVE POSTIVE SARS-CoV-2 nucleic acids MAY BE PRESENT.   A presumptive positive result was obtained on the submitted specimen  and confirmed on repeat testing.  While 2019 novel coronavirus  (SARS-CoV-2) nucleic acids may be present in the submitted sample  additional confirmatory testing may be necessary for epidemiological  and / or clinical management purposes  to differentiate between  SARS-CoV-2 and other Sarbecovirus currently known to infect humans.  If clinically indicated additional testing with an alternate test  methodology [phone]) is advised. The SARS-CoV-2 RNA is generally  detectable in upper and lower respiratory sp ecimens during the acute  phase of  infection. The expected result is Negative. Fact Sheet for Patients:  BoilerBrush.com.cy Fact Sheet for Healthcare Providers: https://pope.com/ This test is not yet approved or cleared by the [place] FDA and has been authorized for detection and/or diagnosis of SARS-CoV-2 by FDA under an Emergency Use Authorization (EUA).  This EUA will remain in effect (meaning this test can be used) for the duration of the COVID-19 declaration under Section 564(b)(1) of the Act, 21 U.S.C. section 360bbb-3(b)(1), unless the authorization is terminated or revoked sooner. Performed at [facility], [address] [place], [place] [address]   Culture, blood (routine x 2)     Status: None   Collection Time: 11/23/2018 10:28 AM   Specimen: BLOOD  Result Value Ref Range Status   Specimen Description BLOOD RIGHT HAND  Final   Special Requests   Final    BOTTLES DRAWN AEROBIC AND ANAEROBIC Blood Culture results may not be optimal due to an inadequate volume of blood received in culture bottles   Culture   Final    NO GROWTH 5 DAYS Performed at [facility], [address] [place], [place] [address]    Report Status 10/31/2018 FINAL  Final  Culture, blood (routine x 2)     Status: Abnormal   Collection Time: 11/15/2018 10:28 AM   Specimen: BLOOD  Result Value Ref Range Status   Specimen Description BLOOD LEFT ARM  Final   Special Requests (A)  Final    AEROCOCCUS SPECIES Blood Culture results may not be optimal due to an inadequate volume of blood received in culture bottles   Culture   Final    NO GROWTH 5 DAYS Performed at [facility], [address] [place], [place] [address]    Report Status 10/31/2018 FINAL  Final  MRSA PCR Screening     Status: None   Collection Time: 11/08/2018  1:45 PM   Specimen: Nasal Mucosa; Nasopharyngeal  Result Value Ref Range Status   MRSA by PCR NEGATIVE NEGATIVE Final    Comment:         The GeneXpert MRSA Assay (FDA approved for NASAL specimens only), is one component of a comprehensive MRSA colonization surveillance program. It is not intended to diagnose MRSA infection nor to guide or monitor treatment for MRSA infections. Performed at [facility], [address] [place], [place] [address]   Culture, respiratory (non-expectorated)     Status: None   Collection Time: 11/13/2018  4:45 PM   Specimen: Tracheal Aspirate; Respiratory  Result Value Ref Range Status   Specimen Description   Final    TRACHEAL ASPIRATE Performed at [facility], [address] [place], [place] [address]    Special Requests   Final    NONE Performed at [facility], [address] [place], [place] [address]    Gram Stain   Final    MODERATE WBC PRESENT, PREDOMINANTLY PMN RARE GRAM POSITIVE COCCI    Culture   Final    RARE Consistent with normal respiratory flora. Performed at [facility], [address], [place], [place] [address]    Report Status 10/29/2018 FINAL  Final  Urine Culture     Status: None   Collection Time: 10/27/18 10:44 AM   Specimen: Urine, Random  Result Value Ref Range Status   Specimen Description   Final    URINE, RANDOM Performed at [facility], [address], [place], [place] [address]  Special Requests   Final    NONE Performed at [facility], [address] [place], [place] [address]    Culture   Final    NO GROWTH Performed at [facility], [address] [place], [place] [address]    Report Status 10/28/2018 FINAL  Final    Lab Basic Metabolic Panel: Recent Labs  Lab 11/14/2018 0757 10/27/18 0404 10/28/18 0418 10/28/18 1414 10/29/18 0726 10/31/18 0505  NA 139 142 141  --  140 139  K 5.3* 4.8 5.7* 4.7 5.4* 5.1  CL 94* 96* 95*  --  93* 91*  CO2 35* 33* 37*  --  37* 38*  GLUCOSE 142* 96 86  --  129* 132*  BUN 46* 50* 46*  --  38* 36*  CREATININE 1.56* 1.53* 1.29*   --  1.02 1.06  CALCIUM 9.2 8.5* 8.0*  --  8.3* 8.7*  MG  --   --  2.6*  --   --   --   PHOS  --   --  4.7*  --   --   --    Liver Function Tests: No results for input(s): AST, ALT, ALKPHOS, BILITOT, PROT, ALBUMIN in the last 168 hours. No results for input(s): LIPASE, AMYLASE in the last 168 hours. No results for input(s): AMMONIA in the last 168 hours. CBC: Recent Labs  Lab 11/19/2018 0757 10/27/18 0404 10/28/18 0418 10/29/18 0726 10/31/18 0505  WBC 11.4* 12.4* 12.5* 10.0 8.9  NEUTROABS 9.3*  --  10.2* 9.6*  --   HGB 14.6 13.6 14.5 13.0 13.6  HCT 50.2 45.1 49.9 43.4 46.5  MCV 94.4 89.3 93.1 90.8 92.8  PLT 450* 359 304 297 307   Cardiac Enzymes: No results for input(s): CKTOTAL, CKMB, CKMBINDEX, TROPONINI in the last 168 hours. Sepsis Labs: Recent Labs  Lab 10/27/18 0404 10/28/18 0418 10/29/18 0726 10/31/18 0505  WBC 12.4* 12.5* 10.0 8.9    Procedures/Operations  Mechanical intubation   Sonda Rumbleana Blakeney, AGNP  Pulmonary/Critical Care Pager [phone] (please enter 7 digits) PCCM Consult Pager [phone] (please enter 7 digits)

## 2018-11-24 NOTE — Death Summary Note (Addendum)
Pt deceased 11/13/2018 at [place]. Died comfortably with daughter present. Pt family grieving appropriately and thanking staff for services provided. All family questions answered.

## 2018-11-24 DEATH — deceased

## 2020-06-23 IMAGING — CT CT HEAD W/O CM
3 of 16 series · 13 of 47 positions shown, 15 images · non-contrast
Comparison: None.

CLINICAL DATA: 77-year-old male with head and neck injury following
fall. Initial encounter.

EXAM:
CT HEAD WITHOUT CONTRAST
CT CERVICAL SPINE WITHOUT CONTRAST
TECHNIQUE: Multidetector CT imaging of the head and cervical spine was
performed following the standard protocol without intravenous
contrast. Multiplanar CT image reconstructions of the cervical spine
were also generated.

[Series 12: orthogonal bone · axial · 0.28mm/px · z∈[-394,-270]mm · 4 of 151 slices shown (1 of 2)]
[im 26/151  bone]
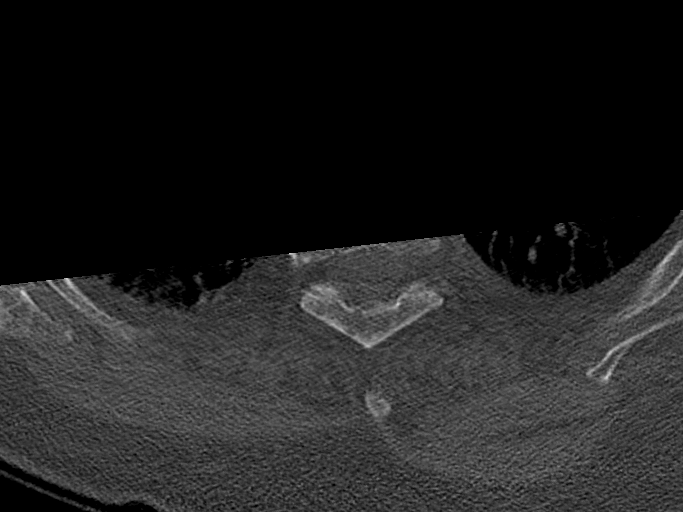
[im 51/151  bone]
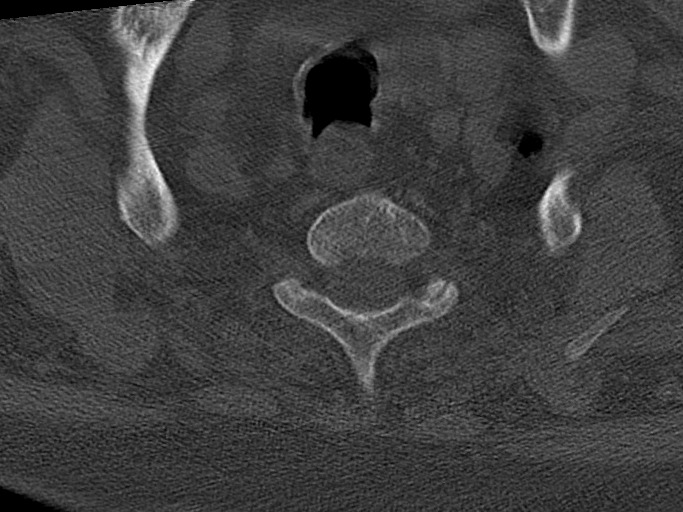
[im 76/151  bone]
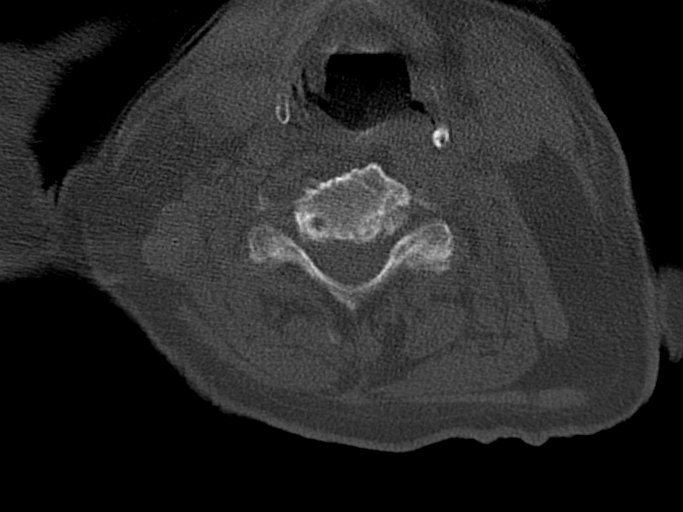
[im 101/151  bone]
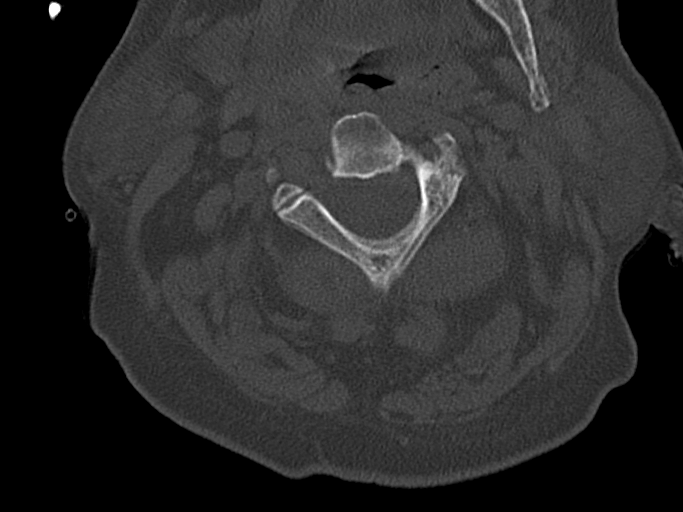

[Series 15: coronal soft tissue · coronal · 0.17mm/px · 3 of 51 slices shown]
[im 13/51  brain]
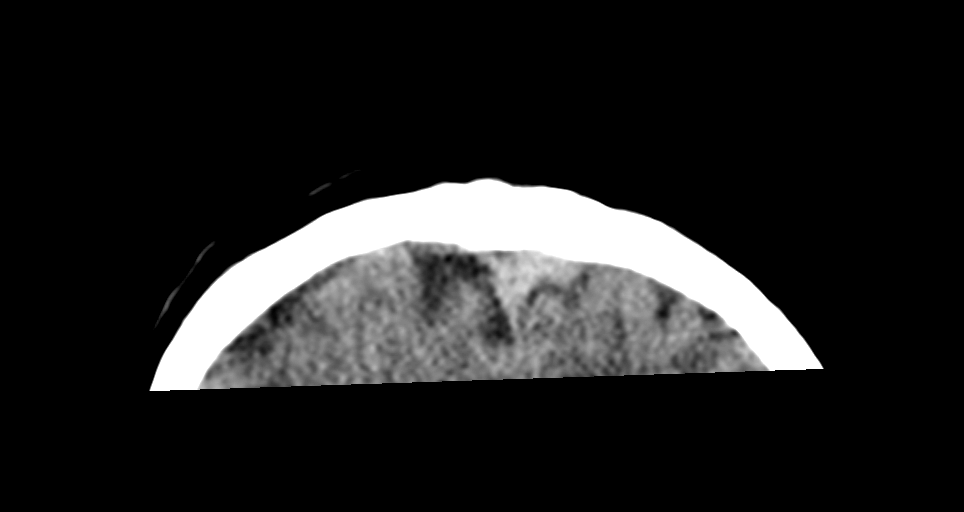
[im 26/51  brain]
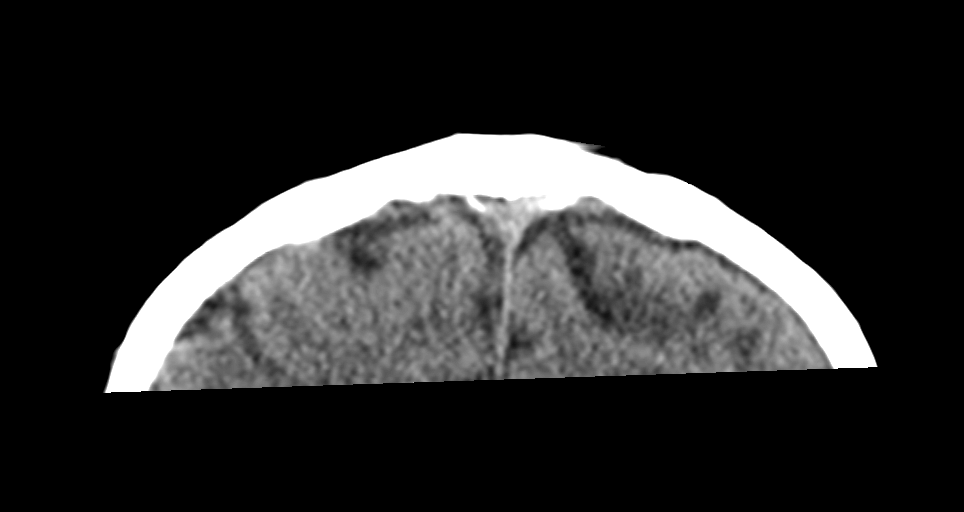
[im 38/51  brain]
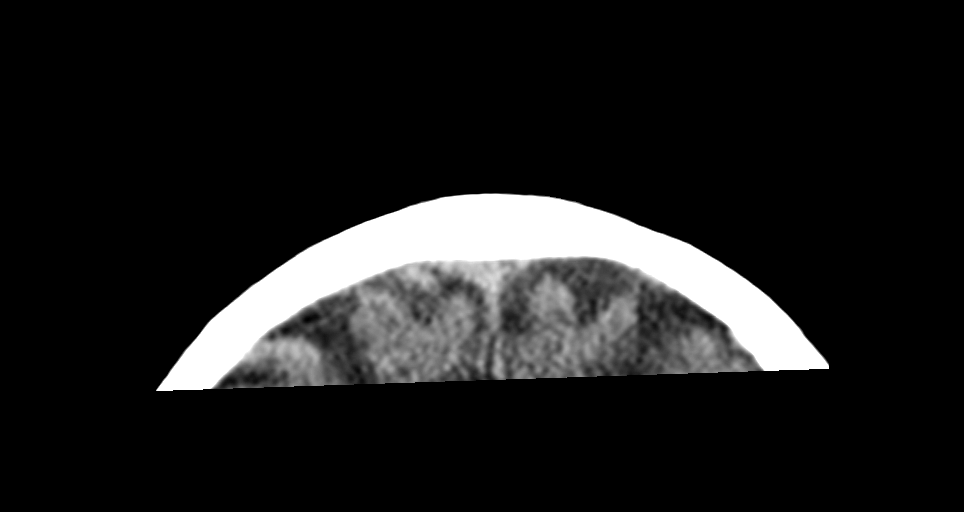

[Series 23: orthogonal bone · axial · 0.33mm/px · z∈[-406,-217]mm · 6 of 154 slices shown, 8 images (2 of 2)]
[im 22/154  brain]
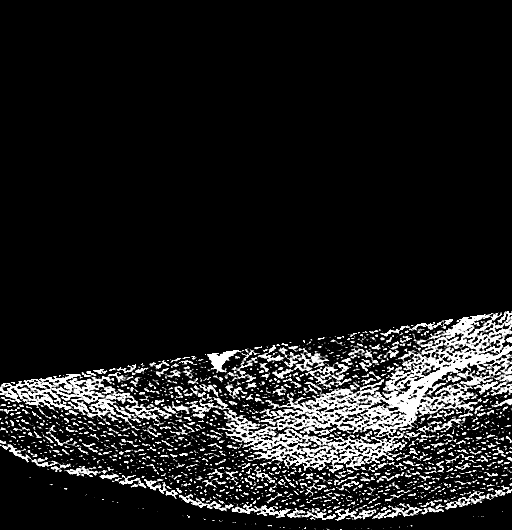
[im 22/154  bone]
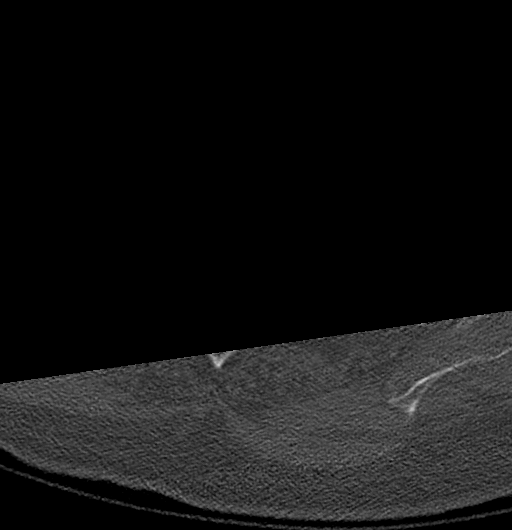
[im 44/154  brain]
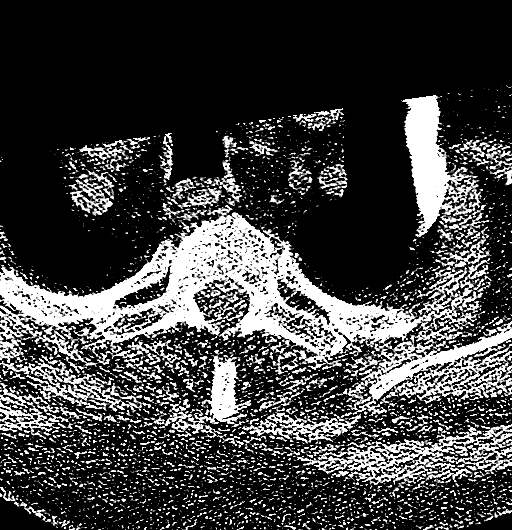
[im 66/154  brain]
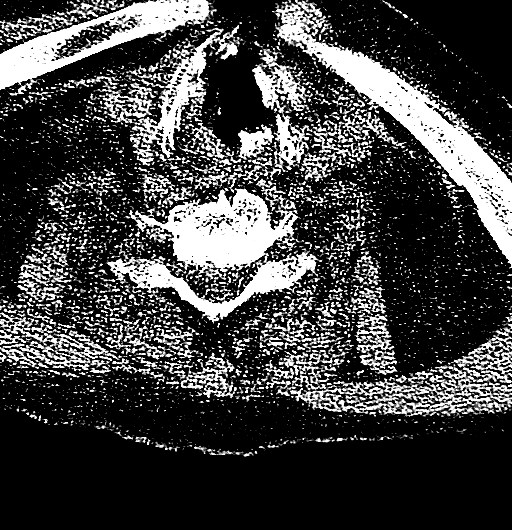
[im 88/154  brain]
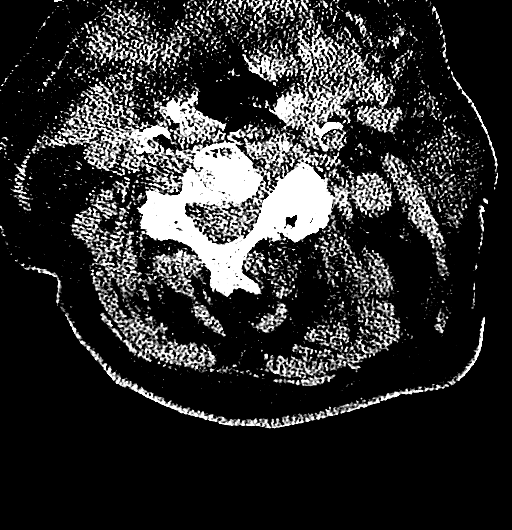
[im 110/154  brain]
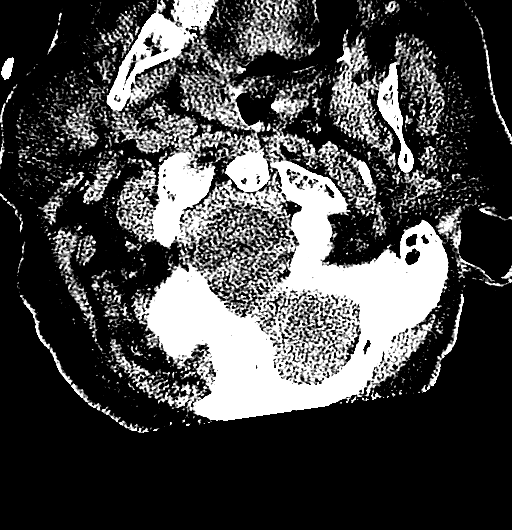
[im 110/154  bone]
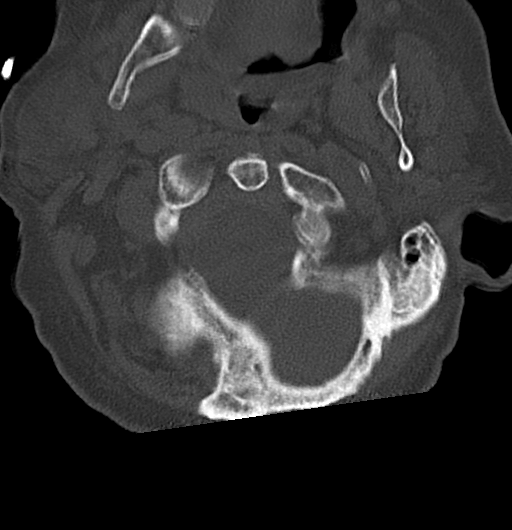
[im 132/154  brain]
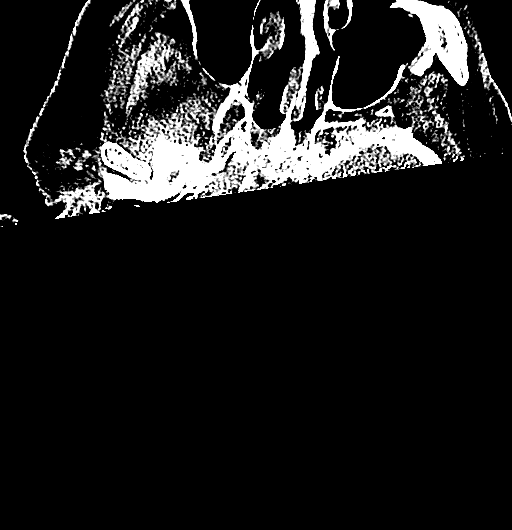

[13 of 47 positions shown; findings below may reference images not displayed]

FINDINGS: CT HEAD FINDINGS

Brain: No evidence of acute infarction, hemorrhage, hydrocephalus,
extra-axial collection or mass lesion/mass effect. Mild generalized
cerebral volume loss noted.

Vascular: No hyperdense vessel or unexpected calcification.

Skull: Normal. Negative for fracture or focal lesion.

Sinuses/Orbits: No acute finding.

Other: None.

CT CERVICAL SPINE FINDINGS

Alignment: Normal.

Skull base and vertebrae: No acute fracture. No primary bone lesion
or focal pathologic process.

Soft tissues and spinal canal: No prevertebral fluid or swelling. No
visible canal hematoma.

Disc levels: Moderate multilevel degenerative disc disease and facet
arthropathy noted. Contributing to LEFT bony foraminal narrowing at
C4-5 and biforaminal bony narrowing at C5-6.

Upper chest: No acute abnormality

Other: None
IMPRESSION: 1. No evidence of acute intracranial abnormality.
2. No static evidence of acute injury to the cervical spine.
Multilevel degenerative changes contributing to bony foraminal
narrowing as discussed.

## 2020-06-27 IMAGING — DX DG CHEST 1V PORT
1 series · 1 of 1 positions shown · non-contrast
Comparison: 10/26/2018

CLINICAL DATA: Shortness of breath. Severe acute respiratory
failure. Hypoxia. Hypercapnia. COPD. Ex-smoker.

EXAM:
PORTABLE CHEST 1 VIEW

[chest ap]
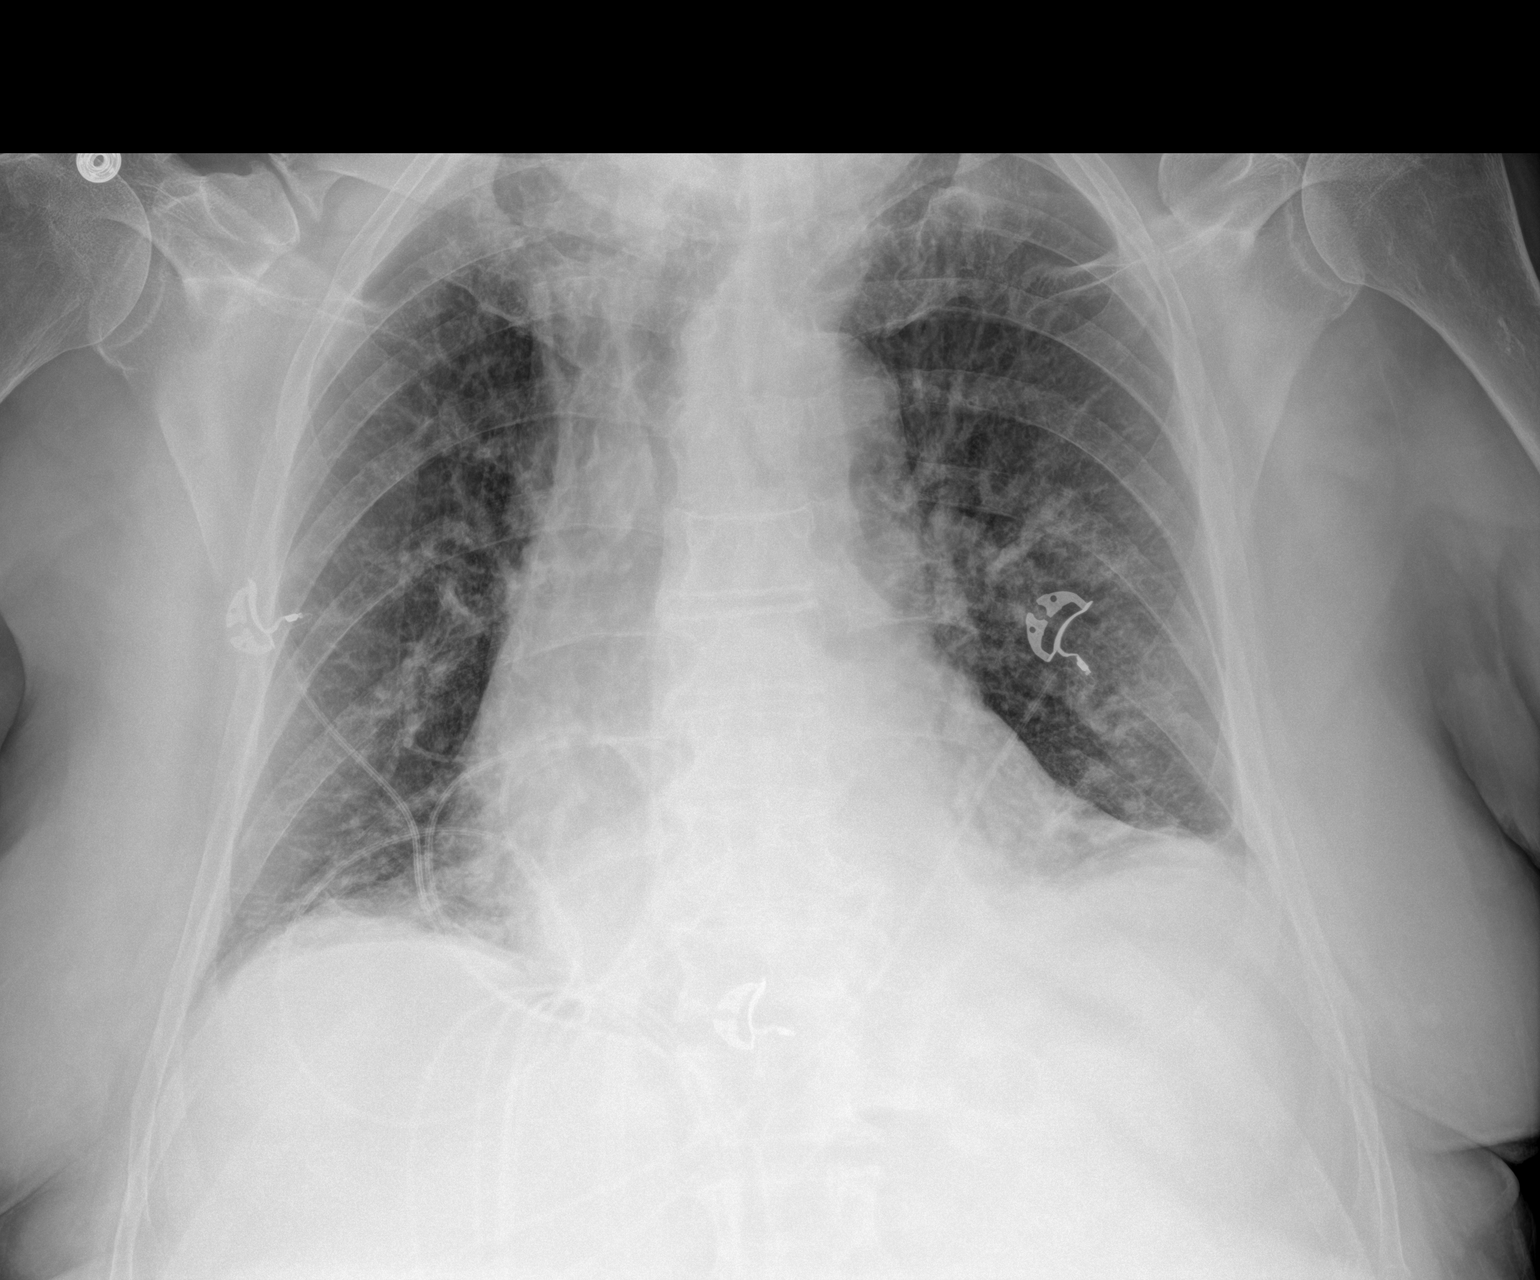

[1 of 1 positions shown; findings below may reference images not displayed]

FINDINGS: Stable enlarged cardiac silhouette. Decreased patchy opacity in the
left mid lung zone. Decreased bibasilar opacity. Stable prominence
of the pulmonary vasculature and interstitial markings. No pleural
fluid seen. Thoracic spine degenerative changes.
IMPRESSION: 1. Decreased left mid lung zone pneumonia.
2. Decreased bibasilar atelectasis and possible pneumonia.
3. Stable cardiomegaly, pulmonary vascular congestion and chronic
interstitial lung disease.
